# Patient Record
Sex: Female | Born: 1995 | Race: White | Hispanic: No | Marital: Single | Smoking: Never smoker
Health system: Southern US, Community
[De-identification: ages and names within clinical notes are randomized; demographics above are authoritative.]

## PROBLEM LIST (undated history)

## (undated) ENCOUNTER — Inpatient Hospital Stay (HOSPITAL_COMMUNITY): Payer: Self-pay

## (undated) DIAGNOSIS — Z789 Other specified health status: Secondary | ICD-10-CM

## (undated) DIAGNOSIS — G510 Bell's palsy: Secondary | ICD-10-CM

## (undated) DIAGNOSIS — R011 Cardiac murmur, unspecified: Secondary | ICD-10-CM

## (undated) HISTORY — DX: Bell's palsy: G51.0

## (undated) HISTORY — PX: NO PAST SURGERIES: SHX2092

---

## 2015-07-20 ENCOUNTER — Inpatient Hospital Stay (HOSPITAL_COMMUNITY)
Admission: AD | Admit: 2015-07-20 | Discharge: 2015-07-20 | Disposition: A | Discharge status: Home / Self Care | Payer: BLUE CROSS/BLUE SHIELD | Source: Ambulatory Visit | Attending: Obstetrics & Gynecology | Admitting: Obstetrics & Gynecology

## 2015-07-20 ENCOUNTER — Encounter (HOSPITAL_COMMUNITY): Payer: Self-pay | Admitting: *Deleted

## 2015-07-20 DIAGNOSIS — Z3A10 10 weeks gestation of pregnancy: Secondary | ICD-10-CM | POA: Insufficient documentation

## 2015-07-20 DIAGNOSIS — O26891 Other specified pregnancy related conditions, first trimester: Secondary | ICD-10-CM | POA: Insufficient documentation

## 2015-07-20 DIAGNOSIS — Z3200 Encounter for pregnancy test, result unknown: Secondary | ICD-10-CM | POA: Diagnosis present

## 2015-07-20 DIAGNOSIS — N911 Secondary amenorrhea: Secondary | ICD-10-CM | POA: Diagnosis not present

## 2015-07-20 HISTORY — DX: Other specified health status: Z78.9

## 2015-07-20 NOTE — Discharge Instructions (Signed)
First Trimester of Pregnancy The first trimester of pregnancy is from week 1 until the end of week 12 (months 1 through 3). During this time, your baby will begin to develop inside you. At 6-8 weeks, the eyes and face are formed, and the heartbeat can be seen on ultrasound. At the end of 12 weeks, all the baby's organs are formed. Prenatal care is all the medical care you receive before the birth of your baby. Make sure you get good prenatal care and follow all of your doctor's instructions. HOME CARE  Medicines  Take medicine only as told by your doctor. Some medicines are safe and some are not during pregnancy.  Take your prenatal vitamins as told by your doctor.  Take medicine that helps you poop (stool softener) as needed if your doctor says it is okay. Diet  Eat regular, healthy meals.  Your doctor will tell you the amount of weight gain that is right for you.  Avoid raw meat and uncooked cheese.  If you feel sick to your stomach (nauseous) or throw up (vomit):  Eat 4 or 5 small meals a day instead of 3 large meals.  Try eating a few soda crackers.  Drink liquids between meals instead of during meals.  If you have a hard time pooping (constipation):  Eat high-fiber foods like fresh vegetables, fruit, and whole grains.  Drink enough fluids to keep your pee (urine) clear or pale yellow. Activity and Exercise  Exercise only as told by your doctor. Stop exercising if you have cramps or pain in your lower belly (abdomen) or low back.  Try to avoid standing for long periods of time. Move your legs often if you must stand in one place for a long time.  Avoid heavy lifting.  Wear low-heeled shoes. Sit and stand up straight.  You can have sex unless your doctor tells you not to. Relief of Pain or Discomfort  Wear a good support bra if your breasts are sore.  Take warm water baths (sitz baths) to soothe pain or discomfort caused by hemorrhoids. Use hemorrhoid cream if your  doctor says it is okay.  Rest with your legs raised if you have leg cramps or low back pain.  Wear support hose if you have puffy, bulging veins (varicose veins) in your legs. Raise (elevate) your feet for 15 minutes, 3-4 times a day. Limit salt in your diet. Prenatal Care  Schedule your prenatal visits by the twelfth week of pregnancy.  Write down your questions. Take them to your prenatal visits.  Keep all your prenatal visits as told by your doctor. Safety  Wear your seat belt at all times when driving.  Make a list of emergency phone numbers. The list should include numbers for family, friends, the hospital, and police and fire departments. General Tips  Ask your doctor for a referral to a local prenatal class. Begin classes no later than at the start of month 6 of your pregnancy.  Ask for help if you need counseling or help with nutrition. Your doctor can give you advice or tell you where to go for help.  Do not use hot tubs, steam rooms, or saunas.  Do not douche or use tampons or scented sanitary pads.  Do not cross your legs for long periods of time.  Avoid litter boxes and soil used by cats.  Avoid all smoking, herbs, and alcohol. Avoid drugs not approved by your doctor.  Visit your dentist. At home, brush your teeth  with a soft toothbrush. Be gentle when you floss. GET HELP IF:  You are dizzy.  You have mild cramps or pressure in your lower belly.  You have a nagging pain in your belly area.  You continue to feel sick to your stomach, throw up, or have watery poop (diarrhea).  You have a bad smelling fluid coming from your vagina.  You have pain with peeing (urination).  You have increased puffiness (swelling) in your face, hands, legs, or ankles. GET HELP RIGHT AWAY IF:   You have a fever.  You are leaking fluid from your vagina.  You have spotting or bleeding from your vagina.  You have very bad belly cramping or pain.  You gain or lose weight  rapidly.  You throw up blood. It may look like coffee grounds.  You are around people who have [place] measles, fifth disease, or chickenpox.  You have a very bad headache.  You have shortness of breath.  You have any kind of trauma, such as from a fall or a car accident. Document Released: 04/03/2008 Document Revised: 03/02/2014 Document Reviewed: 08/26/2013 [facility] Patient Information 2015 [place], [place]. This information is not intended to replace advice given to you by your health care provider. Make sure you discuss any questions you have with your health care provider.   Safe Medications in Pregnancy   Acne: Benzoyl Peroxide Salicylic Acid  Backache/Headache: Tylenol: 2 regular strength every 4 hours OR              2 Extra strength every 6 hours  Colds/Coughs/Allergies: Benadryl (alcohol free) 25 mg every 6 hours as needed Breath right strips Claritin Cepacol throat lozenges Chloraseptic throat spray Cold-Eeze- up to three times per day Cough drops, alcohol free Flonase (by prescription only) Guaifenesin Mucinex Robitussin DM (plain only, alcohol free) Saline nasal spray/drops Sudafed (pseudoephedrine) & Actifed ** use only after [redacted] weeks gestation and if you do not have high blood pressure Tylenol Vicks Vaporub Zinc lozenges Zyrtec   Constipation: Colace Ducolax suppositories Fleet enema Glycerin suppositories Metamucil Milk of magnesia Miralax Senokot Smooth move tea  Diarrhea: Kaopectate Imodium A-D  *NO pepto Bismol  Hemorrhoids: Anusol Anusol HC Preparation H Tucks  Indigestion: Tums Maalox Mylanta Zantac  Pepcid  Insomnia: Benadryl (alcohol free)  every 6 hours as needed Tylenol PM Unisom, no Gelcaps  Leg Cramps: Tums MagGel  Nausea/Vomiting:  Bonine Dramamine Emetrol Ginger extract Sea bands Meclizine  Nausea medication to take during pregnancy:  Unisom (doxylamine succinate 25 mg tablets) Take one tablet  daily at bedtime. If symptoms are not adequately controlled, the dose can be increased to a maximum recommended dose of two tablets daily (1/2 tablet in the morning, 1/2 tablet mid-afternoon and one at bedtime). Vitamin B6  tablets. Take one tablet twice a day (up to 200 mg per day).  Skin Rashes: Aveeno products Benadryl cream or  every 6 hours as needed Calamine Lotion 1% cortisone cream  Yeast infection: Gyne-lotrimin 7 Monistat 7   **If taking multiple medications, please check labels to avoid duplicating the same active ingredients **take medication as directed on the label ** Do not exceed 4000 mg of tylenol in 24 hours **Do not take medications that contain aspirin or ibuprofen

## 2015-07-20 NOTE — MAU Note (Signed)
Pretty much positive she is pregnant.. 2 +HPTs, one in August, one was 2 wks ago.  Just needs confirmation.

## 2015-07-20 NOTE — MAU Provider Note (Signed)
S:  Jacqueline Perkins is a 19 y.o. G1P0 at [redacted]w[redacted]d wks here for confirmation of pregnancy. Patient's last menstrual period was 05/07/2015.Marland Kitchen  Denies any vaginal bleeding or abdominal pain.  Plans to get prenatal care at [facility] [place].  O:  History reviewed. No pertinent past medical history.  No family history on file.   Filed Vitals:   07/20/15 1643  BP: 124/70  Pulse: 81  Temp: 98.2 F (36.8 C)  Resp: 18   General:  A&OX3 with no signs of acute distress. She appears well-developed and well-nourished. No distress.  Neck: Normal range of motion.  Pulmonary/Chest: Effort normal. No respiratory distress.  Musculoskeletal: Normal range of motion.  Neurological: She is alert and oriented to person, place, and time.  Skin: Skin is warm and dry.   A: FHT present by doppler - 167 bpm   P: Explained benefits of taking prenatal vitamins w/folic acid early in pregnancy. Begin prenatal care. Reviewed warning signs of pregnancy. Pregnancy verification letter given  Judeth Horn, NP

## 2015-07-28 ENCOUNTER — Ambulatory Visit (INDEPENDENT_AMBULATORY_CARE_PROVIDER_SITE_OTHER): Payer: BLUE CROSS/BLUE SHIELD | Admitting: Certified Nurse Midwife

## 2015-07-28 ENCOUNTER — Encounter: Payer: Self-pay | Admitting: Certified Nurse Midwife

## 2015-07-28 ENCOUNTER — Encounter: Payer: Self-pay | Admitting: Obstetrics

## 2015-07-28 VITALS — BP 118/71 | HR 83 | Wt 157.0 lb

## 2015-07-28 DIAGNOSIS — Z3401 Encounter for supervision of normal first pregnancy, first trimester: Secondary | ICD-10-CM

## 2015-07-28 DIAGNOSIS — O219 Vomiting of pregnancy, unspecified: Secondary | ICD-10-CM

## 2015-07-28 DIAGNOSIS — Z3400 Encounter for supervision of normal first pregnancy, unspecified trimester: Secondary | ICD-10-CM | POA: Insufficient documentation

## 2015-07-28 LAB — POCT URINALYSIS DIPSTICK
BILIRUBIN UA: NEGATIVE
GLUCOSE UA: NEGATIVE
Ketones, UA: NEGATIVE
Leukocytes, UA: NEGATIVE
Nitrite, UA: NEGATIVE
PH UA: 6
Protein, UA: NEGATIVE
RBC UA: NEGATIVE
UROBILINOGEN UA: NEGATIVE

## 2015-07-28 MED ORDER — PROMETHAZINE HCL 25 MG PO TABS: 25.0000 mg | ORAL_TABLET | Freq: Four times a day (QID) | ORAL | Status: DC | PRN

## 2015-07-28 NOTE — Progress Notes (Signed)
Subjective:    Jacqueline Perkins is being seen today for her first obstetrical visit.  This is not a planned pregnancy. She is at [redacted]w[redacted]d gestation. Her obstetrical history is significant for none. Relationship with FOB: significant other, living together, Jannifer Franklin. Patient does intend to breast feed. Pregnancy history fully reviewed.  The information documented in the HPI was reviewed and verified.  Menstrual History: OB History    Gravida Para Term Preterm AB TAB SAB Ectopic Multiple Living   1               Menarche age: 75  Patient's last menstrual period was 05/07/2015.  Sure of LMP    Past Medical History  Diagnosis Date  . Medical history non-contributory   . Bell's palsy     Past Surgical History  Procedure Laterality Date  . No past surgeries       (Not in a hospital admission) Not on File  Social History  Substance Use Topics  . Smoking status: Never Smoker   . Smokeless tobacco: Not on file  . Alcohol Use: No    Family History  Problem Relation Age of Onset  . Hyperlipidemia Father   . Diabetes Paternal Grandfather   . Bipolar disorder Mother      Review of Systems Constitutional: negative for weight loss Gastrointestinal: + for  Nausea & vomiting Genitourinary:negative for genital lesions and vaginal discharge and dysuria Musculoskeletal:negative for back pain Behavioral/Psych: negative for abusive relationship, depression, illegal drug usage and tobacco use    Objective:    BP 118/71 mmHg  Pulse 83  Wt 157 lb (71.215 kg)  LMP 05/07/2015 General Appearance:    Alert, cooperative, no distress, appears stated age  Head:    Normocephalic, without obvious abnormality, atraumatic  Eyes:    PERRL, conjunctiva/corneas clear, EOM's intact, fundi    benign, both eyes  Ears:    Normal TM's and external ear canals, both ears  Nose:   Nares normal, septum midline, mucosa normal, no drainage    or sinus tenderness  Throat:   Lips, mucosa, and tongue  normal; teeth and gums normal  Neck:   Supple, symmetrical, trachea midline, no adenopathy;    thyroid:  no enlargement/tenderness/nodules; no carotid   bruit or JVD  Back:     Symmetric, no curvature, ROM normal, no CVA tenderness  Lungs:     Clear to auscultation bilaterally, respirations unlabored  Chest Wall:    No tenderness or deformity   Heart:    Regular rate and rhythm, S1 and S2 normal, no murmur, rub   or gallop  Breast Exam:    No tenderness, masses, or nipple abnormality  Abdomen:     Soft, non-tender, bowel sounds active all four quadrants,    no masses, no organomegaly  Genitalia:    Normal female without lesion, discharge or tenderness  Extremities:   Extremities normal, atraumatic, no cyanosis or edema  Pulses:   2+ and symmetric all extremities  Skin:   Skin color, texture, turgor normal, no rashes or lesions  Lymph nodes:   Cervical, supraclavicular, and axillary nodes normal  Neurologic:   CNII-XII intact, normal strength, sensation and reflexes    throughout        Cervix:       Lab Review Urine pregnancy test Labs reviewed yes Radiologic studies reviewed no Assessment:    Pregnancy at [redacted]w[redacted]d weeks   N&V in early pregnancy  Plan:     Prenatal vitamins.  Counseling provided regarding continued use of seat belts, cessation of alcohol consumption, smoking or use of illicit drugs; infection precautions i.e., influenza/TDAP immunizations, toxoplasmosis,CMV, parvovirus, listeria and varicella; workplace safety, exercise during pregnancy; routine dental care, safe medications, sexual activity, hot tubs, saunas, pools, travel, caffeine use, fish and methlymercury, potential toxins, hair treatments, varicose veins Weight gain recommendations per IOM guidelines reviewed: underweight/BMI< 18.5--> gain 28 - 40 lbs; normal weight/BMI 18.5 - 24.9--> gain 25 - 35 lbs; overweight/BMI 25 - 29.9--> gain 15 - 25 lbs; obese/BMI >30->gain  11 - 20 lbs Problem list reviewed and  updated. FIRST/CF mutation testing/NIPT/QUAD SCREEN/fragile X/Ashkenazi Jewish population testing/Spinal muscular atrophy discussed: requested. Role of ultrasound in pregnancy discussed; fetal survey: requested. Amniocentesis discussed: not indicated. VBAC calculator score: VBAC consent form provided Meds ordered this encounter  Medications  . prenatal vitamin w/FE, FA (PRENATAL 1 + 1) 27-1 MG TABS tablet    Sig: Take 1 tablet by mouth daily at 12 noon.   Orders Placed This Encounter  Procedures  . Culture, OB Urine  . SureSwab, Vaginosis/Vaginitis Plus  . Obstetric panel  . HIV antibody  . Hemoglobinopathy evaluation  . Varicella zoster antibody, IgG  . Vit D  25 hydroxy (rtn osteoporosis monitoring)  . TSH  . POCT urinalysis dipstick    Follow up in 4 weeks. 50% of 30 min visit spent on counseling and coordination of care.

## 2015-07-29 LAB — OBSTETRIC PANEL
ANTIBODY SCREEN: NEGATIVE
BASOS PCT: 0 % (ref 0–1)
Basophils Absolute: 0 10*3/uL (ref 0.0–0.1)
EOS PCT: 1 % (ref 0–5)
Eosinophils Absolute: 0.1 10*3/uL (ref 0.0–0.7)
HEMATOCRIT: 37.8 % (ref 36.0–46.0)
HEMOGLOBIN: 12.6 g/dL (ref 12.0–15.0)
Hepatitis B Surface Ag: NEGATIVE
Lymphocytes Relative: 21 % (ref 12–46)
Lymphs Abs: 1.9 10*3/uL (ref 0.7–4.0)
MCH: 26.5 pg (ref 26.0–34.0)
MCHC: 33.3 g/dL (ref 30.0–36.0)
MCV: 79.4 fL (ref 78.0–100.0)
MONO ABS: 0.7 10*3/uL (ref 0.1–1.0)
MPV: 9.1 fL (ref 8.6–12.4)
Monocytes Relative: 8 % (ref 3–12)
NEUTROS ABS: 6.3 10*3/uL (ref 1.7–7.7)
Neutrophils Relative %: 70 % (ref 43–77)
Platelets: 316 10*3/uL (ref 150–400)
RBC: 4.76 MIL/uL (ref 3.87–5.11)
RDW: 15.8 % — ABNORMAL HIGH (ref 11.5–15.5)
RH TYPE: NEGATIVE
Rubella: 1.25 Index — ABNORMAL HIGH (ref <0.90)
WBC: 9 10*3/uL (ref 4.0–10.5)

## 2015-07-29 LAB — HIV ANTIBODY (ROUTINE TESTING W REFLEX): HIV 1&2 Ab, 4th Generation: NONREACTIVE

## 2015-07-29 LAB — TSH: TSH: 2.812 u[IU]/mL (ref 0.350–4.500)

## 2015-07-29 LAB — VARICELLA ZOSTER ANTIBODY, IGG: VARICELLA IGG: 727.4 {index} — AB (ref <135.00)

## 2015-07-29 LAB — VITAMIN D 25 HYDROXY (VIT D DEFICIENCY, FRACTURES): Vit D, 25-Hydroxy: 32 ng/mL (ref 30–100)

## 2015-07-29 LAB — CULTURE, OB URINE
Colony Count: NO GROWTH
Organism ID, Bacteria: NO GROWTH

## 2015-07-30 LAB — HEMOGLOBINOPATHY EVALUATION
HEMOGLOBIN OTHER: 0 %
HGB A2 QUANT: 2.6 % (ref 2.2–3.2)
HGB F QUANT: 0.3 % (ref 0.0–2.0)
Hgb A: 97.1 % (ref 96.8–97.8)
Hgb S Quant: 0 %

## 2015-08-02 LAB — SURESWAB, VAGINOSIS/VAGINITIS PLUS
ATOPOBIUM VAGINAE: NOT DETECTED Log (cells/mL)
C. ALBICANS, DNA: NOT DETECTED
C. GLABRATA, DNA: NOT DETECTED
C. TRACHOMATIS RNA, TMA: NOT DETECTED
C. parapsilosis, DNA: DETECTED — AB
C. tropicalis, DNA: NOT DETECTED
GARDNERELLA VAGINALIS: NOT DETECTED Log (cells/mL)
LACTOBACILLUS SPECIES: 6.3 Log (cells/mL)
MEGASPHAERA SPECIES: NOT DETECTED Log (cells/mL)
N. gonorrhoeae RNA, TMA: NOT DETECTED
T. VAGINALIS RNA, QL TMA: NOT DETECTED

## 2015-08-03 ENCOUNTER — Other Ambulatory Visit: Payer: Self-pay | Admitting: Certified Nurse Midwife

## 2015-08-03 DIAGNOSIS — B373 Candidiasis of vulva and vagina: Secondary | ICD-10-CM

## 2015-08-03 DIAGNOSIS — B3731 Acute candidiasis of vulva and vagina: Secondary | ICD-10-CM

## 2015-08-03 MED ORDER — TERCONAZOLE 0.4 % VA CREA: 1.0000 | TOPICAL_CREAM | Freq: Every day | VAGINAL | Status: DC

## 2015-08-03 MED ORDER — FLUCONAZOLE 100 MG PO TABS: 100.0000 mg | ORAL_TABLET | Freq: Once | ORAL | Status: DC

## 2015-08-04 ENCOUNTER — Other Ambulatory Visit: Payer: Self-pay | Admitting: *Deleted

## 2015-08-04 DIAGNOSIS — B373 Candidiasis of vulva and vagina: Secondary | ICD-10-CM

## 2015-08-04 DIAGNOSIS — B3731 Acute candidiasis of vulva and vagina: Secondary | ICD-10-CM

## 2015-08-04 MED ORDER — FLUCONAZOLE 100 MG PO TABS
100.0000 mg | ORAL_TABLET | Freq: Once | ORAL | Status: DC
Start: 2015-08-04 — End: 2015-11-11

## 2015-08-04 MED ORDER — TERCONAZOLE 0.4 % VA CREA: 1.0000 | TOPICAL_CREAM | Freq: Every day | VAGINAL | Status: DC

## 2015-08-04 NOTE — Progress Notes (Signed)
See lab note for details.  

## 2015-08-19 ENCOUNTER — Ambulatory Visit (INDEPENDENT_AMBULATORY_CARE_PROVIDER_SITE_OTHER): Payer: BLUE CROSS/BLUE SHIELD | Admitting: Certified Nurse Midwife

## 2015-08-19 ENCOUNTER — Encounter: Payer: Self-pay | Admitting: Obstetrics

## 2015-08-19 VITALS — BP 130/80 | HR 94 | Temp 97.8°F | Wt 159.0 lb

## 2015-08-19 DIAGNOSIS — N390 Urinary tract infection, site not specified: Secondary | ICD-10-CM

## 2015-08-19 DIAGNOSIS — Z3481 Encounter for supervision of other normal pregnancy, first trimester: Secondary | ICD-10-CM

## 2015-08-19 DIAGNOSIS — G4700 Insomnia, unspecified: Secondary | ICD-10-CM

## 2015-08-19 DIAGNOSIS — G4452 New daily persistent headache (NDPH): Secondary | ICD-10-CM

## 2015-08-19 DIAGNOSIS — Z3402 Encounter for supervision of normal first pregnancy, second trimester: Secondary | ICD-10-CM

## 2015-08-19 DIAGNOSIS — O219 Vomiting of pregnancy, unspecified: Secondary | ICD-10-CM

## 2015-08-19 LAB — POCT URINALYSIS DIPSTICK
Bilirubin, UA: NEGATIVE
Blood, UA: NEGATIVE
GLUCOSE UA: NEGATIVE
Ketones, UA: NEGATIVE
Leukocytes, UA: NEGATIVE
Nitrite, UA: NEGATIVE
PROTEIN UA: NEGATIVE
Spec Grav, UA: <=1.005
Urobilinogen, UA: NEGATIVE
pH, UA: 8

## 2015-08-19 MED ORDER — BUTALBITAL-APAP-CAFFEINE 50-325-40 MG PO TABS: 1.0000 | ORAL_TABLET | Freq: Four times a day (QID) | ORAL | Status: DC | PRN

## 2015-08-19 MED ORDER — ONDANSETRON HCL 4 MG PO TABS: 4.0000 mg | ORAL_TABLET | Freq: Every day | ORAL | Status: DC | PRN

## 2015-08-19 MED ORDER — DIPHENHYDRAMINE HCL 25 MG PO TABS: 25.0000 mg | ORAL_TABLET | Freq: Four times a day (QID) | ORAL | Status: DC | PRN

## 2015-08-19 MED ORDER — ACETAMINOPHEN 325 MG PO TABS: 650.0000 mg | ORAL_TABLET | Freq: Four times a day (QID) | ORAL | Status: DC | PRN

## 2015-08-19 MED ORDER — DOXYLAMINE SUCCINATE (SLEEP) 25 MG PO TABS: 25.0000 mg | ORAL_TABLET | Freq: Every evening | ORAL | Status: DC | PRN

## 2015-08-19 MED ORDER — MELATONIN-PYRIDOXINE 3-1 MG PO TABS: 1.0000 | ORAL_TABLET | Freq: Every day | ORAL | Status: DC

## 2015-08-19 NOTE — Progress Notes (Signed)
Patient is have symptoms of UTI- cloudy urine, feverish, odor to urine, frequency and random abdominal pain. Patient is also concerned about her exposure to her sister with mono.

## 2015-08-19 NOTE — Progress Notes (Signed)
  Subjective:    Jacqueline PeersCassandra Perkins is a 19 y.o. female being seen today for her obstetrical visit. She is at 816w6d gestation. Patient reports: backache, headache, nausea, no bleeding, no contractions, no cramping, no leaking, vomiting and insomnia.  Reports daily vomiting.  Desires to have improvement with her nausea.  Reports that she used to take unisom for her insomnia.  Reports trying tylenol for her HA, without improvement.  She is back from drill.  Information about birthing classes and breast pump information given.   .  Problem List Items Addressed This Visit    None    Visit Diagnoses    UTI (lower urinary tract infection)    -  Primary    Relevant Orders    POCT urinalysis dipstick (Completed)    Nausea and vomiting in pregnancy prior to [redacted] weeks gestation        Relevant Medications    ondansetron (ZOFRAN) 4 MG tablet    Melatonin-Pyridoxine (MELATONIN/VITAMIN B-6 EX ST) 3-1 MG TABS    New daily persistent headache        Relevant Medications    butalbital-acetaminophen-caffeine (FIORICET) 50-325-40 MG tablet    acetaminophen (TYLENOL) 325 MG tablet    Insomnia        Relevant Medications    diphenhydrAMINE (BENADRYL) 25 MG tablet    doxylamine, Sleep, (UNISOM) 25 MG tablet    Melatonin-Pyridoxine (MELATONIN/VITAMIN B-6 EX ST) 3-1 MG TABS    Supervision of other normal pregnancy, antepartum, first trimester        Supervision of normal first pregnancy in second trimester        Relevant Orders    US OB Comp + 14 Wk      Patient Active Problem List   Diagnosis Date Noted  . Supervision of normal first pregnancy 07/28/2015    Objective:     BP 130/80 mmHg  Pulse 94  Temp(Src) 97.8 F (36.6 C)  Wt 159 lb (72.122 kg)  LMP 05/07/2015 Uterine Size: Below umbilicus     Assessment:    Pregnancy @ 686w6d  weeks N&V in early pregnancy    Insomnia  Daily HA  Plan:    Problem list reviewed and updated. Labs reviewed.  Follow up in 4 weeks. FIRST/CF mutation  testing/NIPT/QUAD SCREEN/fragile X/Ashkenazi Jewish population testing/Spinal muscular atrophy discussed: requested. Role of ultrasound in pregnancy discussed; fetal survey: ordered. Amniocentesis discussed: not indicated. 50% of 25 minute visit spent on counseling and coordination of care.

## 2015-08-20 ENCOUNTER — Encounter: Payer: BLUE CROSS/BLUE SHIELD | Admitting: Certified Nurse Midwife

## 2015-08-25 ENCOUNTER — Encounter: Payer: BLUE CROSS/BLUE SHIELD | Admitting: Certified Nurse Midwife

## 2015-09-16 ENCOUNTER — Ambulatory Visit (INDEPENDENT_AMBULATORY_CARE_PROVIDER_SITE_OTHER): Payer: BLUE CROSS/BLUE SHIELD

## 2015-09-16 ENCOUNTER — Ambulatory Visit (INDEPENDENT_AMBULATORY_CARE_PROVIDER_SITE_OTHER): Payer: Medicaid Other | Admitting: Certified Nurse Midwife

## 2015-09-16 VITALS — BP 130/68 | HR 84 | Temp 98.2°F | Wt 171.0 lb

## 2015-09-16 DIAGNOSIS — Z36 Encounter for antenatal screening of mother: Secondary | ICD-10-CM | POA: Diagnosis not present

## 2015-09-16 DIAGNOSIS — Z3403 Encounter for supervision of normal first pregnancy, third trimester: Secondary | ICD-10-CM | POA: Diagnosis not present

## 2015-09-16 DIAGNOSIS — Z3402 Encounter for supervision of normal first pregnancy, second trimester: Secondary | ICD-10-CM

## 2015-09-16 LAB — POCT URINALYSIS DIPSTICK
BILIRUBIN UA: NEGATIVE
Blood, UA: NEGATIVE
GLUCOSE UA: NEGATIVE
Ketones, UA: NEGATIVE
Leukocytes, UA: NEGATIVE
NITRITE UA: NEGATIVE
Protein, UA: NEGATIVE
Spec Grav, UA: 1.015
UROBILINOGEN UA: NEGATIVE
pH, UA: 8

## 2015-09-16 NOTE — Progress Notes (Signed)
Subjective:    Robyne PeersCassandra Kosman is a 19 y.o. female being seen today for her obstetrical visit. She is at 441w6d gestation. Patient reports: backache, no bleeding, no contractions, no cramping and no leaking . Fetal movement: normal.  Problem List Items Addressed This Visit    None    Visit Diagnoses    Encounter for supervision of normal first pregnancy in third trimester    -  Primary    Relevant Orders    POCT urinalysis dipstick (Completed)    AFP, Quad Screen      Patient Active Problem List   Diagnosis Date Noted  . Supervision of normal first pregnancy 07/28/2015   Objective:    BP 130/68 mmHg  Pulse 84  Temp(Src) 98.2 F (36.8 C)  Wt 171 lb (77.565 kg)  LMP 05/07/2015 FHT: 150 BPM  Uterine Size: size equals dates     Assessment:    Pregnancy @ 8941w6d    Lumbar back pain Plan:    OBGCT: discussed. Signs and symptoms of preterm labor: discussed.  Labs, problem list reviewed and updated 2 hr GTT planned Follow up in 4 weeks.

## 2015-09-21 LAB — AFP, QUAD SCREEN
AFP: 53.6 ng/mL
Age Alone: 1:1160 {titer}
CURR GEST AGE: 18.6 wks.days
Down Syndrome Scr Risk Est: <1:38500 {titer}
HCG TOTAL: 21.02 [IU]/mL
INH: 153.1 pg/mL
Interpretation-AFP: NEGATIVE
MOM FOR INH: 0.97
MoM for AFP: 1.17
MoM for hCG: 0.9
Open Spina bifida: NEGATIVE
Tri 18 Scr Risk Est: NEGATIVE
Trisomy 18 (Edward) Syndrome Interp.: 1:109000 {titer}
UE3 MOM: 1.33
uE3 Value: 1.9 ng/mL

## 2015-10-01 ENCOUNTER — Other Ambulatory Visit: Payer: Self-pay | Admitting: Certified Nurse Midwife

## 2015-10-14 ENCOUNTER — Ambulatory Visit (INDEPENDENT_AMBULATORY_CARE_PROVIDER_SITE_OTHER): Payer: BLUE CROSS/BLUE SHIELD | Admitting: Certified Nurse Midwife

## 2015-10-14 VITALS — BP 113/71 | HR 83 | Temp 97.6°F | Ht 65.0 in | Wt 184.0 lb

## 2015-10-14 DIAGNOSIS — Z3402 Encounter for supervision of normal first pregnancy, second trimester: Secondary | ICD-10-CM

## 2015-10-14 LAB — POCT URINALYSIS DIPSTICK
Bilirubin, UA: NEGATIVE
Blood, UA: NEGATIVE
Glucose, UA: NEGATIVE
KETONES UA: NEGATIVE
LEUKOCYTES UA: NEGATIVE
Nitrite, UA: NEGATIVE
PH UA: 7
PROTEIN UA: NEGATIVE
SPEC GRAV UA: 1.01
Urobilinogen, UA: NEGATIVE

## 2015-10-14 NOTE — Progress Notes (Signed)
Subjective:    Jacqueline PeersCassandra Perkins is a 19 y.o. female being seen today for her obstetrical visit. She is at 6735w6d gestation. Patient reports: no complaints . Fetal movement: normal.  Problem List Items Addressed This Visit    None    Visit Diagnoses    Encounter for supervision of normal first pregnancy in second trimester    -  Primary    Relevant Orders    POCT urinalysis dipstick (Completed)      Patient Active Problem List   Diagnosis Date Noted  . Supervision of normal first pregnancy 07/28/2015   Objective:    BP 113/71 mmHg  Pulse 83  Temp(Src) 97.6 F (36.4 C)  Ht 5\' 5"  (1.651 m)  Wt 184 lb (83.462 kg)  BMI 30.62 kg/m2  LMP 05/07/2015 FHT: 150 BPM  Uterine Size: size equals dates     Assessment:    Pregnancy @ 5935w6d    doing well Plan:    OBGCT: discussed. Signs and symptoms of preterm labor: discussed.  Labs, problem list reviewed and updated 2 hr GTT planned Follow up in 4 weeks.

## 2015-10-31 NOTE — L&D Delivery Note (Addendum)
Delivery Note  This is a 20 year old G 1 P0 who was admitted for Not in labor. and IOL for postdates with suspected LGA infant. She progressed with cytotec, pitocin and epidural to the second stage of labor.  She pushed for 30 min.  Dr. Clearance CootsHarper present for suspected LGA status along with NICU for thick meconium and suspected LGA status.  At 3:37 PM she delivered a viable infant female, cephalic, over an intact perineum.  A nuchal cord   was not identified. Infant placed on maternal abdomen.  Pitocin bolus started.  Delayed cord clamping was performed for 5-10 minutes. Brisk vaginal bleeding noted before cord clamping, Cytotec PR and Methergine IM given prophylactically for prolonged induction.   Cord double clamped and cut.  Placenta to cord blood donation. Apgar scores were 9 and 9. The placenta delivered spontaneously, shultz, with a 3 vessel cord.  Inspection revealed none. The uterus was firm bleeding stable.  EBL was 500.    Placenta and umbilical artery blood gas were not sent.  There were no complications during the procedure.  Mom and baby skin to skin following delivery. Left in stable condition.  Female was delivered via Vaginal, Spontaneous Delivery (Presentation: Right Occiput Anterior).  APGAR: 9, 9; weight  .   Placenta status: Intact, Spontaneous.  Cord: 3 vessels with the following complications: None.  Cord pH: N/A  Anesthesia: Epidural  Episiotomy: None Lacerations: None Suture Repair: none Est. Blood Loss (mL): 500  Mom to postpartum.  Baby to Couplet care / Skin to Skin.  Roe Coombsachelle A Denney, CNM 02/17/2016, 4:27 PM

## 2015-11-11 ENCOUNTER — Ambulatory Visit (INDEPENDENT_AMBULATORY_CARE_PROVIDER_SITE_OTHER): Payer: BLUE CROSS/BLUE SHIELD | Admitting: Certified Nurse Midwife

## 2015-11-11 VITALS — BP 124/71 | HR 97 | Temp 98.6°F | Wt 198.0 lb

## 2015-11-11 DIAGNOSIS — Z3402 Encounter for supervision of normal first pregnancy, second trimester: Secondary | ICD-10-CM

## 2015-11-11 LAB — POCT URINALYSIS DIPSTICK
Bilirubin, UA: NEGATIVE
Blood, UA: NEGATIVE
Glucose, UA: NEGATIVE
KETONES UA: NEGATIVE
NITRITE UA: NEGATIVE
PH UA: 8
PROTEIN UA: NEGATIVE
Spec Grav, UA: 1.01
UROBILINOGEN UA: NEGATIVE

## 2015-11-11 MED ORDER — PRENATE PIXIE 10-0.6-0.4-200 MG PO CAPS: 1.0000 | ORAL_CAPSULE | Freq: Every day | ORAL | Status: DC

## 2015-11-11 NOTE — Progress Notes (Signed)
Subjective:    Jacqueline PeersCassandra Perkins is a 20 y.o. female being seen today for her obstetrical visit. She is at 3072w6d gestation. Patient reports: no complaints . Fetal movement: normal.  Problem List Items Addressed This Visit    None    Visit Diagnoses    Encounter for supervision of normal first pregnancy in second trimester    -  Primary    Relevant Orders    POCT urinalysis dipstick      Patient Active Problem List   Diagnosis Date Noted  . Supervision of normal first pregnancy 07/28/2015   Objective:    BP 124/71 mmHg  Pulse 97  Temp(Src) 98.6 F (37 C)  Wt 198 lb (89.812 kg)  LMP 05/07/2015 FHT: 145 BPM  Uterine Size: size equals dates     Assessment:    Pregnancy @ 7072w6d    RH-  Doing well Plan:    OBGCT: discussed. Signs and symptoms of preterm labor: discussed.  Labs, problem list reviewed and updated 2 hr GTT planned Follow up in 1 week for OGTT & Rhogam.

## 2015-11-18 ENCOUNTER — Other Ambulatory Visit: Payer: BLUE CROSS/BLUE SHIELD

## 2015-11-18 ENCOUNTER — Ambulatory Visit (INDEPENDENT_AMBULATORY_CARE_PROVIDER_SITE_OTHER): Payer: BLUE CROSS/BLUE SHIELD | Admitting: Certified Nurse Midwife

## 2015-11-18 VITALS — BP 121/82 | HR 98 | Temp 98.0°F | Wt 200.0 lb

## 2015-11-18 DIAGNOSIS — Z3402 Encounter for supervision of normal first pregnancy, second trimester: Secondary | ICD-10-CM

## 2015-11-18 LAB — POCT URINALYSIS DIPSTICK
BILIRUBIN UA: NEGATIVE
Blood, UA: NEGATIVE
GLUCOSE UA: NEGATIVE
KETONES UA: NEGATIVE
Leukocytes, UA: NEGATIVE
Nitrite, UA: NEGATIVE
PH UA: 7
Protein, UA: NEGATIVE
Urobilinogen, UA: NEGATIVE

## 2015-11-18 LAB — CBC
HCT: 34.5 % — ABNORMAL LOW (ref 36.0–46.0)
HEMOGLOBIN: 11.4 g/dL — AB (ref 12.0–15.0)
MCH: 28.6 pg (ref 26.0–34.0)
MCHC: 33 g/dL (ref 30.0–36.0)
MCV: 86.7 fL (ref 78.0–100.0)
MPV: 8.2 fL — AB (ref 8.6–12.4)
Platelets: 363 10*3/uL (ref 150–400)
RBC: 3.98 MIL/uL (ref 3.87–5.11)
RDW: 13.8 % (ref 11.5–15.5)
WBC: 12.1 10*3/uL — AB (ref 4.0–10.5)

## 2015-11-18 LAB — GLUCOSE TOLERANCE, 2 HOURS W/ 1HR
GLUCOSE, 2 HOUR: 96 mg/dL (ref 70–139)
GLUCOSE, FASTING: 74 mg/dL (ref 65–99)
GLUCOSE: 110 mg/dL (ref 70–170)

## 2015-11-18 NOTE — Progress Notes (Signed)
Rhogam injection given at today's visit, pt tolerated well. Lot [id number] Exp 08-13-2016 NDC 5409-8119-14

## 2015-11-19 LAB — HIV ANTIBODY (ROUTINE TESTING W REFLEX): HIV: NONREACTIVE

## 2015-11-19 LAB — RPR

## 2015-11-19 NOTE — Progress Notes (Signed)
Subjective:    Jacqueline Perkins is a 20 y.o. female being seen today for her obstetrical visit. She is at [redacted]w[redacted]d gestation. Patient reports backache, no bleeding, no contractions, no cramping and no leaking. Fetal movement: normal.  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  Encouraged patient to follow chain of command, if needed we would be happy to give her a note regarding her pregnancy and physical requirements.    Problem List Items Addressed This Visit    None    Visit Diagnoses    Encounter for supervision of normal first pregnancy in second trimester    -  Primary    Relevant Orders    Glucose Tolerance, 2 Hours w/1 Hour (Completed)    CBC    HIV antibody    RPR    POCT urinalysis dipstick (Completed)      Patient Active Problem List   Diagnosis Date Noted  . Supervision of normal first pregnancy 07/28/2015   Objective:    BP 121/82 mmHg  Pulse 98  Temp(Src) 98 F (36.7 C)  Wt 200 lb (90.719 kg)  LMP 05/07/2015 FHT:  145 BPM  Uterine Size: size equals dates  Presentation: cephalic     Assessment:    Pregnancy @ [redacted]w[redacted]d weeks   Doing well  RH-: Rhogam given  Plan:   Rx: abdominal maternity support belt   labs reviewed, problem list updated Consent signed. GBS planning TDAP offered  Rhogam given for RH negative Pediatrician: discussed. Infant feeding: plans to breastfeed. Maternity leave: discussed. Cigarette smoking: never smoked. Orders Placed This Encounter  Procedures  . Glucose Tolerance, 2 Hours w/1 Hour  . CBC  . HIV antibody  . RPR  . POCT urinalysis dipstick   No orders of the defined types were placed in this encounter.   Follow up in 2 Weeks.

## 2015-12-01 ENCOUNTER — Inpatient Hospital Stay (HOSPITAL_COMMUNITY): Payer: BLUE CROSS/BLUE SHIELD

## 2015-12-01 ENCOUNTER — Inpatient Hospital Stay (HOSPITAL_COMMUNITY)
Admission: AD | Admit: 2015-12-01 | Discharge: 2015-12-02 | Disposition: A | Discharge status: Home / Self Care | Payer: BLUE CROSS/BLUE SHIELD | Source: Ambulatory Visit | Attending: Obstetrics | Admitting: Obstetrics

## 2015-12-01 DIAGNOSIS — T1490 Injury, unspecified: Secondary | ICD-10-CM

## 2015-12-01 DIAGNOSIS — Z3A29 29 weeks gestation of pregnancy: Secondary | ICD-10-CM | POA: Insufficient documentation

## 2015-12-01 DIAGNOSIS — O9A212 Injury, poisoning and certain other consequences of external causes complicating pregnancy, second trimester: Secondary | ICD-10-CM

## 2015-12-01 DIAGNOSIS — W109XXA Fall (on) (from) unspecified stairs and steps, initial encounter: Secondary | ICD-10-CM | POA: Insufficient documentation

## 2015-12-01 DIAGNOSIS — O26893 Other specified pregnancy related conditions, third trimester: Secondary | ICD-10-CM | POA: Insufficient documentation

## 2015-12-01 DIAGNOSIS — O9A213 Injury, poisoning and certain other consequences of external causes complicating pregnancy, third trimester: Secondary | ICD-10-CM

## 2015-12-01 LAB — URINALYSIS, ROUTINE W REFLEX MICROSCOPIC
BILIRUBIN URINE: NEGATIVE
GLUCOSE, UA: NEGATIVE mg/dL
Hgb urine dipstick: NEGATIVE
KETONES UR: NEGATIVE mg/dL
Leukocytes, UA: NEGATIVE
Nitrite: NEGATIVE
PH: 6.5 (ref 5.0–8.0)
Protein, ur: NEGATIVE mg/dL
SPECIFIC GRAVITY, URINE: 1.01 (ref 1.005–1.030)

## 2015-12-01 NOTE — MAU Provider Note (Signed)
History     CSN: [id number]  Arrival date and time: 12/01/15 2242   First Provider Initiated Contact with Patient 12/01/15 2334      Chief Complaint  Patient presents with  . Fall   HPI Comments: Jacqueline Perkins is a 20 y.o. G1P0 at [redacted]w[redacted]d who presents today after a fall. She states that she fell down 2 steps around 2100. She denies any VB or LOF. She states that the fetus has been active. She states that her next appointment with Dr. Clearance Coots is in the morning.   Fall The accident occurred 1 to 3 hours ago (around 2100 ). The fall occurred while walking (missed last 2 steps when going down steps. ). She fell from a height of 1 to 2 ft. She landed on hard floor. There was no blood loss. The point of impact was the buttocks. The pain is present in the back. The pain is at a severity of 2/10. Pertinent negatives include no abdominal pain, fever, headaches, nausea or vomiting. She has tried nothing for the symptoms.    Past Medical History  Diagnosis Date  . Medical history non-contributory   . Bell's palsy     Past Surgical History  Procedure Laterality Date  . No past surgeries      Family History  Problem Relation Age of Onset  . Hyperlipidemia Father   . Diabetes Paternal Grandfather   . Bipolar disorder Mother     Social History  Substance Use Topics  . Smoking status: Never Smoker   . Smokeless tobacco: Not on file  . Alcohol Use: No    Allergies: No Known Allergies  Prescriptions prior to admission  Medication Sig Dispense Refill Last Dose  . acetaminophen (TYLENOL) 325 MG tablet Take 2 tablets (650 mg total) by mouth every 6 (six) hours as needed. (Patient not taking: Reported on 11/18/2015) 120 tablet 12 Not Taking  . butalbital-acetaminophen-caffeine (FIORICET) 50-325-40 MG tablet Take 1-2 tablets by mouth every 6 (six) hours as needed for headache. (Patient not taking: Reported on 09/16/2015) 45 tablet 5 Not Taking  . Prenat-FeAsp-Meth-FA-DHA w/o A (PRENATE  PIXIE) 10-0.6-0.4-200 MG CAPS Take 1 tablet by mouth daily. 30 capsule 12 Taking    Review of Systems  Constitutional: Negative for fever and chills.  Gastrointestinal: Negative for nausea, vomiting, abdominal pain, diarrhea and constipation.  Genitourinary: Negative for dysuria, urgency and frequency.  Neurological: Negative for headaches.   Physical Exam   Blood pressure 150/76, pulse 89, temperature 98.8 F (37.1 C), temperature source Oral, resp. rate 16, last menstrual period 05/07/2015, SpO2 100 %.  Physical Exam  Nursing note and vitals reviewed. Constitutional: She is oriented to person, place, and time. She appears well-developed and well-nourished. No distress.  HENT:  Head: Normocephalic.  Cardiovascular: Normal rate.   Respiratory: Effort normal.  GI: Soft. There is no tenderness. There is no rebound.  Musculoskeletal: Normal range of motion. She exhibits no tenderness.  Full ROM of both ankles No point tenderness along spine.   Neurological: She is alert and oriented to person, place, and time.  Skin: Skin is warm and dry.  Psychiatric: She has a normal mood and affect.   FHT: 140, moderate with 15x15 accels, no decels Toco: no UCs   [place]: AFI 14.31, 48%tile VTX No previa No abruption seen  MAU Course  Procedures  MDM 0100: FHT remain reactive. 4 hour since fall. Will DC home. Patient has appointment in the morning.   Assessment and Plan  1. Traumatic injury during pregnancy in second trimester    DC home Comfort measures reviewed  3rd Trimester precautions  Bleeding precautions PTL precautions  Fetal kick counts RX: none  Return to MAU as needed FU with OB as planned  Follow-up Information    Follow up with Brock Bad, MD.   Specialty:  Obstetrics and Gynecology   Why:  As scheduled   Contact information:   [address] [place] [address] [phone]         Tawnya Crook 12/01/2015, 11:37 PM

## 2015-12-01 NOTE — MAU Note (Signed)
Pt states she had a fall at around 2100, states she missed the last 2 steps and fell on her ankle and bottom. 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 Denies bleeding. Reports positive fetal movement since the fall.

## 2015-12-02 ENCOUNTER — Encounter: Payer: Self-pay | Admitting: *Deleted

## 2015-12-02 ENCOUNTER — Ambulatory Visit (INDEPENDENT_AMBULATORY_CARE_PROVIDER_SITE_OTHER): Payer: BLUE CROSS/BLUE SHIELD | Admitting: Certified Nurse Midwife

## 2015-12-02 VITALS — BP 112/77 | HR 105 | Wt 204.0 lb

## 2015-12-02 DIAGNOSIS — Z3403 Encounter for supervision of normal first pregnancy, third trimester: Secondary | ICD-10-CM

## 2015-12-02 DIAGNOSIS — O26893 Other specified pregnancy related conditions, third trimester: Secondary | ICD-10-CM | POA: Diagnosis not present

## 2015-12-02 LAB — POCT URINALYSIS DIPSTICK
Bilirubin, UA: NEGATIVE
GLUCOSE UA: NEGATIVE
Ketones, UA: NEGATIVE
Leukocytes, UA: NEGATIVE
Nitrite, UA: NEGATIVE
PROTEIN UA: NEGATIVE
RBC UA: NEGATIVE
SPEC GRAV UA: 1.01
UROBILINOGEN UA: NEGATIVE
pH, UA: 7

## 2015-12-02 NOTE — Discharge Instructions (Signed)

## 2015-12-02 NOTE — Progress Notes (Signed)
Subjective:    Jacqueline Perkins is a 20 y.o. female being seen today for her obstetrical visit. She is at [redacted]w[redacted]d gestation. Patient reports backache, no bleeding, no contractions, no cramping and no leaking. Fetal movement: normal.  Recent fall down stairs at house yesterday, went to MAU, was cleared.  Normal [place] results reviewed.  FOB is out of town.  Has abdominal maternity support belt that she wears.  Is supposed to have drill this weekend, letter written for no drill until after postpartum due to recent fall and lower abdominal pain/lumbar back pain.    Problem List Items Addressed This Visit    None    Visit Diagnoses    Encounter for supervision of normal first pregnancy in third trimester    -  Primary    Relevant Orders    POCT urinalysis dipstick (Completed)      Patient Active Problem List   Diagnosis Date Noted  . 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     labs reviewed, problem list updated Consent signed. GBS planning TDAP offered  Rhogam given for RH negative Pediatrician: discussed. Infant feeding: plans to breastfeed, has breast pump. Maternity leave: discussed, forms done. Cigarette smoking: never smoked. Orders Placed This Encounter  Procedures  . POCT urinalysis dipstick   No orders of the defined types were placed in this encounter.   Follow up in 2 Weeks.

## 2015-12-16 ENCOUNTER — Ambulatory Visit (INDEPENDENT_AMBULATORY_CARE_PROVIDER_SITE_OTHER): Payer: BLUE CROSS/BLUE SHIELD | Admitting: Certified Nurse Midwife

## 2015-12-16 VITALS — BP 128/82 | HR 104 | Temp 98.3°F | Wt 210.0 lb

## 2015-12-16 DIAGNOSIS — Z3403 Encounter for supervision of normal first pregnancy, third trimester: Secondary | ICD-10-CM

## 2015-12-16 DIAGNOSIS — O219 Vomiting of pregnancy, unspecified: Secondary | ICD-10-CM

## 2015-12-16 DIAGNOSIS — G4700 Insomnia, unspecified: Secondary | ICD-10-CM

## 2015-12-16 LAB — POCT URINALYSIS DIPSTICK
BILIRUBIN UA: NEGATIVE
Blood, UA: NEGATIVE
GLUCOSE UA: NEGATIVE
KETONES UA: NEGATIVE
Nitrite, UA: NEGATIVE
PH UA: 7
Protein, UA: NEGATIVE
Spec Grav, UA: 1.015
Urobilinogen, UA: NEGATIVE

## 2015-12-16 MED ORDER — DIPHENHYDRAMINE HCL (SLEEP) 50 MG PO CAPS: 1.0000 | ORAL_CAPSULE | Freq: Every day | ORAL | Status: DC

## 2015-12-16 MED ORDER — ONDANSETRON HCL 4 MG PO TABS: 4.0000 mg | ORAL_TABLET | Freq: Every day | ORAL | Status: DC | PRN

## 2015-12-16 MED ORDER — DIPHENHYDRAMINE HCL 25 MG PO TABS: 50.0000 mg | ORAL_TABLET | Freq: Every day | ORAL | Status: DC

## 2015-12-17 ENCOUNTER — Encounter: Payer: Self-pay | Admitting: *Deleted

## 2015-12-17 NOTE — Progress Notes (Addendum)
Subjective:    Jacqueline Perkins is a 20 y.o. female being seen today for her obstetrical visit. She is at [redacted]w[redacted]d gestation. Patient reports backache, fatigue, no bleeding, no leaking and occasional contractions. Reports insomnia, has not tried anything for it, states she takes some naps.  Reports N&V here recently.  Also reports some vaginal discharge with itching, that started about a week ago.  Denies any burning with urination.  Fetal movement: normal.  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  Spouse is due back in town in one week.  She is supposed to have drill in a few weeks.  Fax number received to fax the letter to.  Reports wearing abdominal maternity support belt during the day.    Problem List Items Addressed This Visit    None    Visit Diagnoses    Insomnia    -  Primary    Relevant Medications    DiphenhydrAMINE HCl, Sleep, 50 MG CAPS    diphenhydrAMINE (BENADRYL) 25 MG tablet    Nausea and vomiting during pregnancy        Relevant Medications    ondansetron (ZOFRAN) 4 MG tablet    Encounter for supervision of normal first pregnancy in third trimester        Relevant Orders    SureSwab, Vaginosis/Vaginitis Plus    POCT urinalysis dipstick (Completed)      Patient Active Problem List   Diagnosis Date Noted  . 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 GBS planning TDAP offered  Rhogam given for RH negative Pediatrician: discussed. Infant feeding: plans to  breastfeed. Maternity leave: discussed. Cigarette smoking: never smoked. Orders Placed This Encounter  Procedures  . SureSwab, Vaginosis/Vaginitis Plus  . POCT urinalysis dipstick   Meds ordered this encounter  Medications  . ondansetron (ZOFRAN) 4 MG tablet    Sig: Take 1 tablet (4 mg total) by mouth daily as needed.    Dispense:  30 tablet    Refill:  1  . DiphenhydrAMINE HCl, Sleep, 50 MG CAPS    Sig: Take 1 capsule (50 mg total) by mouth at bedtime.    Dispense:  30 each    Refill:  4  . diphenhydrAMINE (BENADRYL) 25 MG tablet    Sig: Take 2 tablets (50 mg total) by mouth at bedtime.    Dispense:  90 tablet    Refill:  4   Follow up in 2 Weeks.

## 2015-12-23 LAB — SURESWAB, VAGINOSIS/VAGINITIS PLUS
Atopobium vaginae: NOT DETECTED Log (cells/mL)
C. ALBICANS, DNA: NOT DETECTED
C. GLABRATA, DNA: NOT DETECTED
C. PARAPSILOSIS, DNA: NOT DETECTED
C. TRACHOMATIS RNA, TMA: NOT DETECTED
C. TROPICALIS, DNA: NOT DETECTED
Gardnerella vaginalis: NOT DETECTED Log (cells/mL)
LACTOBACILLUS SPECIES: 6.9 Log (cells/mL)
MEGASPHAERA SPECIES: NOT DETECTED Log (cells/mL)
N. gonorrhoeae RNA, TMA: NOT DETECTED
T. VAGINALIS RNA, QL TMA: NOT DETECTED

## 2015-12-28 ENCOUNTER — Other Ambulatory Visit: Payer: Self-pay | Admitting: Certified Nurse Midwife

## 2015-12-29 ENCOUNTER — Other Ambulatory Visit: Payer: Self-pay | Admitting: Certified Nurse Midwife

## 2015-12-30 ENCOUNTER — Ambulatory Visit (INDEPENDENT_AMBULATORY_CARE_PROVIDER_SITE_OTHER): Payer: BLUE CROSS/BLUE SHIELD | Admitting: Certified Nurse Midwife

## 2015-12-30 VITALS — BP 132/79 | HR 91 | Temp 97.8°F | Wt 214.0 lb

## 2015-12-30 DIAGNOSIS — Z3403 Encounter for supervision of normal first pregnancy, third trimester: Secondary | ICD-10-CM

## 2015-12-30 LAB — POCT URINALYSIS DIPSTICK
BILIRUBIN UA: NEGATIVE
GLUCOSE UA: NEGATIVE
KETONES UA: NEGATIVE
Leukocytes, UA: NEGATIVE
Nitrite, UA: NEGATIVE
Protein, UA: NEGATIVE
RBC UA: NEGATIVE
SPEC GRAV UA: 1.02
UROBILINOGEN UA: NEGATIVE
pH, UA: 6

## 2015-12-30 NOTE — Progress Notes (Signed)
Subjective:    Jacqueline Perkins is a 20 y.o. female being seen today for her obstetrical visit. She is at [redacted]w[redacted]d gestation. Patient reports backache, fatigue, heartburn, no bleeding, no contractions, no cramping and no leaking. Fetal movement: normal.  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  Spoke with her commander with her permission, no vital medical information was shared.  Commander stated that she would have desk duty, no heavy lifting, pushing, pulling, standing.  She will be able to have frequent breaks, water, bathroom privileges.  Patient verbalized understanding.   States she was going to take birth classes this weekend, educated to reschedule.    Problem List Items Addressed This Visit    None    Visit Diagnoses    Encounter for supervision of normal first pregnancy in third trimester    -  Primary    Relevant Orders    POCT urinalysis dipstick (Completed)      Patient Active Problem List   Diagnosis Date Noted  . Supervision of normal first pregnancy 07/28/2015   Objective:    BP 132/79 mmHg  Pulse 91  Temp(Src) 97.8 F (36.6 C)  Wt 214 lb (97.07 kg)  LMP 05/07/2015 FHT:  145 BPM  Uterine Size: size equals dates  Presentation: cephalic     Assessment:    Pregnancy @ [redacted]w[redacted]d weeks   Normal pregnancy discomforts.    Plan:     labs reviewed, problem list updated Consent signed. GBS planning TDAP offered  Rhogam given for RH negative Pediatrician: discussed. Infant feeding: plans to breastfeed. Maternity leave: discussed. Cigarette smoking: never smoked. Orders Placed This Encounter  Procedures  . POCT urinalysis dipstick   No orders of the defined types were placed in this encounter.   Follow up in 2 Weeks with GBS.

## 2016-01-06 ENCOUNTER — Encounter (HOSPITAL_COMMUNITY): Payer: Self-pay

## 2016-01-06 ENCOUNTER — Inpatient Hospital Stay (HOSPITAL_COMMUNITY)
Admission: AD | Admit: 2016-01-06 | Discharge: 2016-01-07 | Disposition: A | Discharge status: Home / Self Care | Payer: BLUE CROSS/BLUE SHIELD | Source: Ambulatory Visit | Attending: Obstetrics | Admitting: Obstetrics

## 2016-01-06 DIAGNOSIS — Z3A35 35 weeks gestation of pregnancy: Secondary | ICD-10-CM | POA: Diagnosis not present

## 2016-01-06 DIAGNOSIS — O4703 False labor before 37 completed weeks of gestation, third trimester: Secondary | ICD-10-CM | POA: Insufficient documentation

## 2016-01-06 DIAGNOSIS — N898 Other specified noninflammatory disorders of vagina: Secondary | ICD-10-CM | POA: Diagnosis present

## 2016-01-06 DIAGNOSIS — O479 False labor, unspecified: Secondary | ICD-10-CM

## 2016-01-06 LAB — URINALYSIS, ROUTINE W REFLEX MICROSCOPIC
BILIRUBIN URINE: NEGATIVE
GLUCOSE, UA: NEGATIVE mg/dL
HGB URINE DIPSTICK: NEGATIVE
KETONES UR: NEGATIVE mg/dL
Leukocytes, UA: NEGATIVE
Nitrite: NEGATIVE
PROTEIN: NEGATIVE mg/dL
Specific Gravity, Urine: <1.005 — ABNORMAL LOW (ref 1.005–1.030)
pH: 5.5 (ref 5.0–8.0)

## 2016-01-06 LAB — OB RESULTS CONSOLE GC/CHLAMYDIA: GC PROBE AMP, GENITAL: NEGATIVE

## 2016-01-06 NOTE — MAU Provider Note (Signed)
History     CSN: [id number]  Arrival date and time: 01/06/16 2215   First Provider Initiated Contact with Patient 01/06/16 2342      Chief Complaint  Patient presents with  . Contractions  . Vaginal Discharge   Vaginal Discharge The patient's primary symptoms include pelvic pain and vaginal discharge. This is a new problem. The current episode started today. The problem occurs intermittently (about every 15 mins for about 4 hours. Seems to have stopped now. ). The problem has been resolved. The patient is experiencing no pain. The problem affects both sides. She is pregnant. Associated symptoms include abdominal pain. Pertinent negatives include no chills, constipation, diarrhea, dysuria, fever, frequency, nausea, urgency or vomiting. The vaginal discharge was copious and clear. There has been no bleeding. Nothing aggravates the symptoms. She has tried nothing for the symptoms. The treatment provided no relief.    Past Medical History  Diagnosis Date  . Medical history non-contributory   . Bell's palsy     Past Surgical History  Procedure Laterality Date  . No past surgeries      Family History  Problem Relation Age of Onset  . Hyperlipidemia Father   . Diabetes Paternal Grandfather   . Bipolar disorder Mother     Social History  Substance Use Topics  . Smoking status: Never Smoker   . Smokeless tobacco: None  . Alcohol Use: No    Allergies: No Known Allergies  Prescriptions prior to admission  Medication Sig Dispense Refill Last Dose  . DiphenhydrAMINE HCl, Sleep, 50 MG CAPS Take 1 capsule (50 mg total) by mouth at bedtime. 30 each 4 01/05/2016 at Unknown time  . Prenat-FeAsp-Meth-FA-DHA w/o A (PRENATE PIXIE) 10-0.6-0.4-200 MG CAPS Take 1 tablet by mouth daily. 30 capsule 12 01/05/2016 at Unknown time  . acetaminophen (TYLENOL) 325 MG tablet Take 2 tablets (650 mg total) by mouth every 6 (six) hours as needed. (Patient not taking: Reported on 11/18/2015) 120 tablet 12 Not  Taking  . butalbital-acetaminophen-caffeine (FIORICET) 50-325-40 MG tablet Take 1-2 tablets by mouth every 6 (six) hours as needed for headache. (Patient not taking: Reported on 09/16/2015) 45 tablet 5 Not Taking    Review of Systems  Constitutional: Negative for fever and chills.  Gastrointestinal: Positive for abdominal pain. Negative for nausea, vomiting, diarrhea and constipation.  Genitourinary: Positive for vaginal discharge and pelvic pain. Negative for dysuria, urgency and frequency.   Physical Exam   Blood pressure 149/85, pulse 112, temperature 98.8 F (37.1 C), temperature source Oral, resp. rate 16, height 5\' 5"  (1.651 m), weight 97.523 kg (215 lb), last menstrual period 05/07/2015, SpO2 100 %.  Physical Exam  Nursing note and vitals reviewed. Constitutional: She appears well-developed and well-nourished. No distress.  HENT:  Head: Normocephalic.  Cardiovascular: Normal rate.   Respiratory: Effort normal.  GI: Soft.  Genitourinary:   External: no lesion Vagina: small amount of white discharge Cervix: pink, smooth, FT/THICK/-2  Uterus: AGA    Musculoskeletal: Normal range of motion.  Neurological: She is alert.  Skin: Skin is warm and dry.  Psychiatric: She has a normal mood and affect.   FHT: 135, moderate with 15x15 accels, no decels Toco: no UCs   Results for orders placed or performed during the hospital encounter of 01/06/16 (from the past 24 hour(s))  Urinalysis, Routine w reflex microscopic (not at [facility])     Status: Abnormal   Collection Time: 01/06/16 10:39 PM  Result Value Ref Range   Color, Urine YELLOW YELLOW  APPearance CLEAR CLEAR   Specific Gravity, Urine <1.005 (L) 1.005 - 1.030   pH 5.5 5.0 - 8.0   Glucose, UA NEGATIVE NEGATIVE mg/dL   Hgb urine dipstick NEGATIVE NEGATIVE   Bilirubin Urine NEGATIVE NEGATIVE   Ketones, ur NEGATIVE NEGATIVE mg/dL   Protein, ur NEGATIVE NEGATIVE mg/dL   Nitrite NEGATIVE NEGATIVE   Leukocytes, UA NEGATIVE  NEGATIVE  Wet prep, genital     Status: Abnormal   Collection Time: 01/06/16 11:50 PM  Result Value Ref Range   Yeast Wet Prep HPF POC NONE SEEN NONE SEEN   Trich, Wet Prep NONE SEEN NONE SEEN   Clue Cells Wet Prep HPF POC NONE SEEN NONE SEEN   WBC, Wet Prep HPF POC MANY (A) NONE SEEN   Sperm NONE SEEN     MAU Course  Procedures  MDM   Assessment and Plan   1. Braxton Hicks contractions    DC home Comfort measures reviewed  3rd Trimester precautions  PTL precautions  Fetal kick counts RX: none  Return to MAU as needed FU with OB as planned  Follow-up Information    Follow up with Roe Coombs, CNM.   Specialty:  Certified Nurse Midwife   Why:  As scheduled   Contact information:   [address] [place] [address] [phone]         Tawnya Crook 01/06/2016, 11:43 PM

## 2016-01-06 NOTE — MAU Note (Signed)
Pt reports increased pressure and pain in her pelvis since the weekend. Also reports increased discharge and contractions q 15 minutes today.

## 2016-01-07 DIAGNOSIS — O4703 False labor before 37 completed weeks of gestation, third trimester: Secondary | ICD-10-CM

## 2016-01-07 DIAGNOSIS — Z3A35 35 weeks gestation of pregnancy: Secondary | ICD-10-CM | POA: Diagnosis not present

## 2016-01-07 LAB — WET PREP, GENITAL
CLUE CELLS WET PREP: NONE SEEN
SPERM: NONE SEEN
TRICH WET PREP: NONE SEEN
YEAST WET PREP: NONE SEEN

## 2016-01-07 LAB — GC/CHLAMYDIA PROBE AMP (~~LOC~~) NOT AT ARMC
CHLAMYDIA, DNA PROBE: NEGATIVE
Neisseria Gonorrhea: NEGATIVE

## 2016-01-07 NOTE — Discharge Instructions (Signed)

## 2016-01-13 ENCOUNTER — Ambulatory Visit (INDEPENDENT_AMBULATORY_CARE_PROVIDER_SITE_OTHER): Payer: BLUE CROSS/BLUE SHIELD | Admitting: Certified Nurse Midwife

## 2016-01-13 VITALS — BP 127/88 | HR 123 | Temp 98.1°F | Wt 217.0 lb

## 2016-01-13 DIAGNOSIS — Z3403 Encounter for supervision of normal first pregnancy, third trimester: Secondary | ICD-10-CM

## 2016-01-13 LAB — POCT URINALYSIS DIPSTICK
BILIRUBIN UA: NEGATIVE
Blood, UA: NEGATIVE
GLUCOSE UA: NEGATIVE
KETONES UA: NEGATIVE
LEUKOCYTES UA: NEGATIVE
NITRITE UA: NEGATIVE
PH UA: 7
Protein, UA: NEGATIVE
Spec Grav, UA: 1.02
Urobilinogen, UA: NEGATIVE

## 2016-01-13 NOTE — Progress Notes (Signed)
Subjective:    Jacqueline Perkins is a 20 y.o. female being seen today for her obstetrical visit. She is at 3837w6d gestation. Patient reports backache, no bleeding, no cramping, no leaking and occasional contractions. Fetal movement: normal.  Problem List Items Addressed This Visit    None     Patient Active Problem List   Diagnosis Date Noted  . Supervision of normal first pregnancy 07/28/2015   Objective:    BP 127/88 mmHg  Pulse 123  Temp(Src) 98.1 F (36.7 C)  Wt 217 lb (98.431 kg)  LMP 05/07/2015 FHT:  145 BPM  Uterine Size: size equals dates  Presentation: cephalic   Cervix: 1cm, long, thick, posterior.    Assessment:    Pregnancy @ 2137w6d weeks   Doing well  Plan:     labs reviewed, problem list updated Consent signed. GBS sent TDAP offered  Rhogam given for RH negative Pediatrician: discussed. Infant feeding: plans to breastfeed. Maternity leave: discussed. Cigarette smoking: never smoked. No orders of the defined types were placed in this encounter.   No orders of the defined types were placed in this encounter.   Follow up in 1 Week.

## 2016-01-15 LAB — NUSWAB VAGINITIS PLUS (VG+)
CANDIDA ALBICANS, NAA: NEGATIVE
CANDIDA GLABRATA, NAA: NEGATIVE
CHLAMYDIA TRACHOMATIS, NAA: NEGATIVE
NEISSERIA GONORRHOEAE, NAA: NEGATIVE
TRICH VAG BY NAA: NEGATIVE

## 2016-01-15 LAB — STREP GP B NAA: Strep Gp B NAA: NEGATIVE

## 2016-01-17 ENCOUNTER — Inpatient Hospital Stay (HOSPITAL_COMMUNITY)
Admission: AD | Admit: 2016-01-17 | Discharge: 2016-01-17 | Disposition: A | Discharge status: Home / Self Care | Payer: BLUE CROSS/BLUE SHIELD | Source: Ambulatory Visit | Attending: Obstetrics | Admitting: Obstetrics

## 2016-01-17 ENCOUNTER — Encounter (HOSPITAL_COMMUNITY): Payer: Self-pay | Admitting: *Deleted

## 2016-01-17 DIAGNOSIS — N949 Unspecified condition associated with female genital organs and menstrual cycle: Secondary | ICD-10-CM

## 2016-01-17 DIAGNOSIS — O9989 Other specified diseases and conditions complicating pregnancy, childbirth and the puerperium: Secondary | ICD-10-CM

## 2016-01-17 DIAGNOSIS — O26893 Other specified pregnancy related conditions, third trimester: Secondary | ICD-10-CM | POA: Insufficient documentation

## 2016-01-17 DIAGNOSIS — R102 Pelvic and perineal pain: Secondary | ICD-10-CM | POA: Diagnosis not present

## 2016-01-17 DIAGNOSIS — Z3A36 36 weeks gestation of pregnancy: Secondary | ICD-10-CM | POA: Insufficient documentation

## 2016-01-17 DIAGNOSIS — O26899 Other specified pregnancy related conditions, unspecified trimester: Secondary | ICD-10-CM

## 2016-01-17 LAB — URINALYSIS, ROUTINE W REFLEX MICROSCOPIC
Bilirubin Urine: NEGATIVE
GLUCOSE, UA: NEGATIVE mg/dL
HGB URINE DIPSTICK: NEGATIVE
Ketones, ur: NEGATIVE mg/dL
Leukocytes, UA: NEGATIVE
Nitrite: NEGATIVE
PROTEIN: NEGATIVE mg/dL
Specific Gravity, Urine: <1.005 — ABNORMAL LOW (ref 1.005–1.030)
pH: 5.5 (ref 5.0–8.0)

## 2016-01-17 NOTE — Discharge Instructions (Signed)
Round Ligament Pain  The round ligament is a cord of muscle and tissue that helps to support the uterus. It can become a source of pain during pregnancy if it becomes stretched or twisted as the baby grows. The pain usually begins in the second trimester of pregnancy, and it can come and go until the baby is delivered. It is not a serious problem, and it does not cause harm to the baby.  Round ligament pain is usually a short, sharp, and pinching pain, but it can also be a dull, lingering, and aching pain. The pain is felt in the lower side of the abdomen or in the groin. It usually starts deep in the groin and moves up to the outside of the hip area. Pain can occur with:   A sudden change in position.   Rolling over in bed.   Coughing or sneezing.   Physical activity.  HOME CARE INSTRUCTIONS  Watch your condition for any changes. Take these steps to help with your pain:   When the pain starts, relax. Then try:    Sitting down.    Flexing your knees up to your abdomen.    Lying on your side with one pillow under your abdomen and another pillow between your legs.    Sitting in a warm bath for 15-20 minutes or until the pain goes away.   Take over-the-counter and prescription medicines only as told by your health care provider.   Move slowly when you sit and stand.   Avoid long walks if they cause pain.   Stop or lessen your physical activities if they cause pain.  SEEK MEDICAL CARE IF:   Your pain does not go away with treatment.   You feel pain in your back that you did not have before.   Your medicine is not helping.  SEEK IMMEDIATE MEDICAL CARE IF:   You develop a fever or chills.   You develop uterine contractions.   You develop vaginal bleeding.   You develop nausea or vomiting.   You develop diarrhea.   You have pain when you urinate.     This information is not intended to replace advice given to you by your health care provider. Make sure you discuss any questions you have with your health  care provider.     Document Released: 07/25/2008 Document Revised: 01/08/2012 Document Reviewed: 12/23/2014  Elsevier Interactive Patient Education 2016 [employer]

## 2016-01-17 NOTE — MAU Provider Note (Signed)
History   G1 @ 36.3 wks in with c/o left lower quad pain since yesterday. States pain is dull pulling pain but with fetal movement pain is sharp.  CSN: [id number]  Arrival date & time 01/17/16  1724   None     Chief Complaint  Patient presents with  . Abdominal Pain    HPI  Past Medical History  Diagnosis Date  . Medical history non-contributory   . Bell's palsy     Past Surgical History  Procedure Laterality Date  . No past surgeries      Family History  Problem Relation Age of Onset  . Hyperlipidemia Father   . Diabetes Paternal Grandfather   . Bipolar disorder Mother     Social History  Substance Use Topics  . Smoking status: Never Smoker   . Smokeless tobacco: None  . Alcohol Use: No    OB History    Gravida Para Term Preterm AB TAB SAB Ectopic Multiple Living   1               Review of Systems  Constitutional: Negative.   HENT: Negative.   Eyes: Negative.   Respiratory: Negative.   Cardiovascular: Negative.   Gastrointestinal: Positive for abdominal pain.  Endocrine: Negative.   Genitourinary: Negative.   Musculoskeletal: Negative.   Skin: Negative.   Allergic/Immunologic: Negative.   Neurological: Negative.   Hematological: Negative.   Psychiatric/Behavioral: Negative.     Allergies  Review of patient's allergies indicates no known allergies.  Home Medications  No current outpatient prescriptions on file.  BP 135/80 mmHg  Pulse 120  Temp(Src) 98.5 F (36.9 C)  Resp 18  SpO2 100%  LMP 05/07/2015  Physical Exam  Constitutional: She is oriented to person, place, and time. She appears well-developed and well-nourished.  HENT:  Head: Normocephalic.  Eyes: Pupils are equal, round, and reactive to light.  Neck: Normal range of motion.  Cardiovascular: Normal rate, regular rhythm, normal heart sounds and intact distal pulses.   Pulmonary/Chest: Effort normal and breath sounds normal.  Abdominal: Soft. Bowel sounds are normal.   Genitourinary: Vagina normal and uterus normal.  Musculoskeletal: Normal range of motion.  Neurological: She is alert and oriented to person, place, and time. She has normal reflexes.  Skin: Skin is warm and dry.  Psychiatric: She has a normal mood and affect. Her behavior is normal. Judgment and thought content normal.    MAU Course  Procedures (including critical care time)  Labs Reviewed  URINALYSIS, ROUTINE W REFLEX MICROSCOPIC (NOT AT [facility])   No results found.   No diagnosis found.    MDM  FHR pattern reassuring,  Round lig pain will d/c home with reactive FHR pattern.

## 2016-01-17 NOTE — MAU Note (Signed)
Patient started having "round ligament pain on my right side this morning that never goes away."  Denies LOF or vaginal bleeding.  Reports +fetal movement.

## 2016-01-21 ENCOUNTER — Ambulatory Visit (INDEPENDENT_AMBULATORY_CARE_PROVIDER_SITE_OTHER): Payer: BLUE CROSS/BLUE SHIELD | Admitting: Certified Nurse Midwife

## 2016-01-21 VITALS — BP 121/83 | HR 109 | Temp 98.6°F | Wt 218.0 lb

## 2016-01-21 DIAGNOSIS — Z3403 Encounter for supervision of normal first pregnancy, third trimester: Secondary | ICD-10-CM

## 2016-01-21 LAB — POCT URINALYSIS DIPSTICK
BILIRUBIN UA: NEGATIVE
Blood, UA: NEGATIVE
GLUCOSE UA: NEGATIVE
Ketones, UA: NEGATIVE
Leukocytes, UA: NEGATIVE
Nitrite, UA: NEGATIVE
Protein, UA: NEGATIVE
SPEC GRAV UA: 1.02
UROBILINOGEN UA: NEGATIVE
pH, UA: 6

## 2016-01-21 NOTE — Progress Notes (Signed)
Subjective:    Jacqueline Perkins is a 20 y.o. female being seen today for her obstetrical visit. She is at 376w0d gestation. Patient reports no bleeding, no leaking and occasional contractions. Fetal movement: normal.  Problem List Items Addressed This Visit    None    Visit Diagnoses    Encounter for supervision of normal first pregnancy in third trimester    -  Primary    Relevant Orders    POCT urinalysis dipstick      Patient Active Problem List   Diagnosis Date Noted  . Supervision of normal first pregnancy 07/28/2015    Objective:    BP 121/83 mmHg  Pulse 109  Temp(Src) 98.6 F (37 C)  Wt 218 lb (98.884 kg)  LMP 05/07/2015 FHT: 125 BPM  Uterine Size: size equals dates  Presentations: cephalic  Pelvic Exam:              Dilation: 0.5 cm       Effacement: Long             Station:  -3    Consistency: soft            Position: posterior     Assessment:    Pregnancy @ 336w0d weeks   Plan:   Plans for delivery: Vaginal anticipated; labs reviewed; problem list updated Counseling: Consent signed. Infant feeding: plans to breastfeed. Cigarette smoking: never smoked. L&D discussion: symptoms of labor, discussed when to call, discussed what number to call, anesthetic/analgesic options reviewed and delivering clinician:  plans Certified Nurse-Midwife. Postpartum supports and preparation: circumcision discussed and contraception plans discussed.  Follow up in 1 Week.

## 2016-01-28 ENCOUNTER — Ambulatory Visit (INDEPENDENT_AMBULATORY_CARE_PROVIDER_SITE_OTHER): Payer: BLUE CROSS/BLUE SHIELD | Admitting: Certified Nurse Midwife

## 2016-01-28 VITALS — BP 121/79 | HR 104 | Temp 98.1°F | Wt 218.0 lb

## 2016-01-28 DIAGNOSIS — Z3403 Encounter for supervision of normal first pregnancy, third trimester: Secondary | ICD-10-CM

## 2016-01-28 LAB — POCT URINALYSIS DIPSTICK
BILIRUBIN UA: NEGATIVE
Glucose, UA: NEGATIVE
KETONES UA: NEGATIVE
Leukocytes, UA: NEGATIVE
Nitrite, UA: NEGATIVE
PH UA: 7.5
PROTEIN UA: NEGATIVE
RBC UA: NEGATIVE
SPEC GRAV UA: 1.01
Urobilinogen, UA: NEGATIVE

## 2016-01-28 NOTE — Progress Notes (Signed)
Subjective:    Jacqueline PeersCassandra Perkins is a 20 y.o. female being seen today for her obstetrical visit. She is at 5348w0d gestation. Patient reports backache, no bleeding, no leaking, occasional contractions and reports five hours of contractions overnight, nothing this AM.   Fetal movement: normal.  Problem List Items Addressed This Visit    None     Patient Active Problem List   Diagnosis Date Noted  . Supervision of normal first pregnancy 07/28/2015    Objective:    BP 121/79 mmHg  Pulse 104  Temp(Src) 98.1 F (36.7 C)  Wt 218 lb (98.884 kg)  LMP 05/07/2015 FHT: 145 BPM  Uterine Size: size equals dates  Presentations: cephalic  Pelvic Exam:              Dilation: closed inner os, outer os 1 cm       Effacement: Long             Station:  -3    Consistency: soft            Position: posterior     Assessment:    Pregnancy @ 2948w0d weeks   doing well, normal pregnancy discomforts  Plan:   Plans for delivery: Vaginal anticipated; labs reviewed; problem list updated Counseling: Consent signed. Infant feeding: plans to breastfeed. Cigarette smoking: never smoked. L&D discussion: symptoms of labor, discussed when to call, discussed what number to call, anesthetic/analgesic options reviewed and delivering clinician:  plans Certified Nurse-Midwife. Postpartum supports and preparation: circumcision discussed and contraception plans discussed.  Follow up in 1 Week.

## 2016-01-28 NOTE — Addendum Note (Signed)
Addended by: Henriette CombsHATTON, Izac Faulkenberry L on: 01/28/2016 04:51 PM   Modules accepted: Orders

## 2016-02-04 ENCOUNTER — Ambulatory Visit (INDEPENDENT_AMBULATORY_CARE_PROVIDER_SITE_OTHER): Payer: BLUE CROSS/BLUE SHIELD | Admitting: Certified Nurse Midwife

## 2016-02-04 VITALS — BP 132/88 | HR 119 | Temp 98.2°F | Wt 228.0 lb

## 2016-02-04 DIAGNOSIS — O26843 Uterine size-date discrepancy, third trimester: Secondary | ICD-10-CM

## 2016-02-04 DIAGNOSIS — Z3403 Encounter for supervision of normal first pregnancy, third trimester: Secondary | ICD-10-CM

## 2016-02-04 LAB — POCT URINALYSIS DIPSTICK
BILIRUBIN UA: NEGATIVE
Blood, UA: NEGATIVE
GLUCOSE UA: NEGATIVE
Ketones, UA: NEGATIVE
Nitrite, UA: NEGATIVE
Protein, UA: NEGATIVE
Spec Grav, UA: 1.015
Urobilinogen, UA: NEGATIVE
pH, UA: 7

## 2016-02-04 NOTE — Progress Notes (Signed)
Patient  Concerned about recent contractions causing pain in chest (like a panic attack). Increased swelling in hands after walking.

## 2016-02-04 NOTE — Progress Notes (Signed)
Subjective:    Jacqueline PeersCassandra Perkins is a 20 y.o. female being seen today for her obstetrical visit. She is at 6154w0d gestation. Patient reports backache, no bleeding, no leaking and occasional contractions. Fetal movement: normal.  Problem List Items Addressed This Visit      Other   Supervision of normal first pregnancy - Primary   Relevant Orders   POCT urinalysis dipstick (Completed)    Other Visit Diagnoses    Uterine size date discrepancy, third trimester        Relevant Orders    US MFM OB FOLLOW UP      Patient Active Problem List   Diagnosis Date Noted  . Supervision of normal first pregnancy 07/28/2015    Objective:    BP 132/88 mmHg  Pulse 119  Temp(Src) 98.2 F (36.8 C)  Wt 228 lb (103.42 kg)  LMP 05/07/2015 FHT: 145 BPM  Uterine Size: size greater than dates  Presentations: cephalic  Pelvic Exam:              Dilation: 1cm       Effacement: Long             Station:  Floating    Consistency: soft            Position: anterior     Assessment:    Pregnancy @ 5554w0d weeks   S>D  Plan:    Discussed possible IOL. Plans for delivery: Vaginal anticipated; labs reviewed; problem list updated Counseling: Consent signed. Infant feeding: plans to breastfeed. Cigarette smoking: never smoked. L&D discussion: symptoms of labor, discussed when to call, discussed what number to call, anesthetic/analgesic options reviewed and delivering clinician:  plans Certified Nurse-Midwife. Postpartum supports and preparation: circumcision discussed and contraception plans discussed.  Follow up in 1 Week with NST.

## 2016-02-09 ENCOUNTER — Encounter (HOSPITAL_COMMUNITY): Payer: Self-pay

## 2016-02-09 ENCOUNTER — Other Ambulatory Visit: Payer: Self-pay | Admitting: Certified Nurse Midwife

## 2016-02-09 ENCOUNTER — Ambulatory Visit (HOSPITAL_COMMUNITY)
Admission: RE | Admit: 2016-02-09 | Discharge: 2016-02-09 | Disposition: A | Discharge status: Home / Self Care | Payer: BLUE CROSS/BLUE SHIELD | Source: Ambulatory Visit | Attending: Certified Nurse Midwife | Admitting: Certified Nurse Midwife

## 2016-02-09 DIAGNOSIS — Z3A39 39 weeks gestation of pregnancy: Secondary | ICD-10-CM | POA: Diagnosis not present

## 2016-02-09 DIAGNOSIS — O26843 Uterine size-date discrepancy, third trimester: Secondary | ICD-10-CM

## 2016-02-09 DIAGNOSIS — O99213 Obesity complicating pregnancy, third trimester: Secondary | ICD-10-CM | POA: Insufficient documentation

## 2016-02-11 ENCOUNTER — Ambulatory Visit (INDEPENDENT_AMBULATORY_CARE_PROVIDER_SITE_OTHER): Payer: BLUE CROSS/BLUE SHIELD | Admitting: Certified Nurse Midwife

## 2016-02-11 ENCOUNTER — Telehealth (HOSPITAL_COMMUNITY): Payer: Self-pay | Admitting: *Deleted

## 2016-02-11 VITALS — BP 133/84 | HR 93 | Temp 98.9°F | Wt 226.0 lb

## 2016-02-11 DIAGNOSIS — Z3403 Encounter for supervision of normal first pregnancy, third trimester: Secondary | ICD-10-CM

## 2016-02-11 LAB — POCT URINALYSIS DIPSTICK
Bilirubin, UA: NEGATIVE
Blood, UA: NEGATIVE
Glucose, UA: NEGATIVE
KETONES UA: NEGATIVE
LEUKOCYTES UA: NEGATIVE
Nitrite, UA: NEGATIVE
PH UA: 7
PROTEIN UA: NEGATIVE
UROBILINOGEN UA: NEGATIVE

## 2016-02-11 NOTE — Telephone Encounter (Signed)
Preadmission screen  

## 2016-02-11 NOTE — Progress Notes (Signed)
Subjective:    Jacqueline Perkins is a 20 y.o. female being seen today for her obstetrical visit. She is at 7120w0d gestation. Patient reports backache, no bleeding, no leaking and occasional contractions. Fetal movement: normal.  Problem List Items Addressed This Visit      Other   Supervision of normal first pregnancy - Primary   Relevant Orders   POCT urinalysis dipstick (Completed)     Patient Active Problem List   Diagnosis Date Noted  . Supervision of normal first pregnancy 07/28/2015    Objective:    BP 133/84 mmHg  Pulse 93  Temp(Src) 98.9 F (37.2 C)  Wt 226 lb (102.513 kg)  LMP 05/07/2015 FHT:  133 BPM  Uterine Size: size greater than dates  Presentation: cephalic  Pelvic Exam: deferred     Assessment:    Pregnancy @ 5020w0d  weeks   Suspected LGA infant with EFW of 90% Plan:    Postdates management: discussed fetal surveillance and induction, discussed fetal movement. Induction: scheduled for 02/15/16, written information given.  Follow up postpartum.

## 2016-02-11 NOTE — Progress Notes (Signed)
Patient is feeling more contractions Just ready for the baby to come. And discuss the growth scan.

## 2016-02-15 ENCOUNTER — Inpatient Hospital Stay (HOSPITAL_COMMUNITY)
Admission: RE | Admit: 2016-02-15 | Discharge: 2016-02-19 | DRG: 775 | Disposition: A | Discharge status: Home / Self Care | Payer: BLUE CROSS/BLUE SHIELD | Source: Ambulatory Visit | Attending: Obstetrics | Admitting: Obstetrics

## 2016-02-15 ENCOUNTER — Encounter (HOSPITAL_COMMUNITY): Payer: Self-pay

## 2016-02-15 DIAGNOSIS — O3663X0 Maternal care for excessive fetal growth, third trimester, not applicable or unspecified: Secondary | ICD-10-CM | POA: Diagnosis present

## 2016-02-15 DIAGNOSIS — O9962 Diseases of the digestive system complicating childbirth: Secondary | ICD-10-CM | POA: Diagnosis present

## 2016-02-15 DIAGNOSIS — Z833 Family history of diabetes mellitus: Secondary | ICD-10-CM

## 2016-02-15 DIAGNOSIS — K219 Gastro-esophageal reflux disease without esophagitis: Secondary | ICD-10-CM | POA: Diagnosis present

## 2016-02-15 DIAGNOSIS — Z818 Family history of other mental and behavioral disorders: Secondary | ICD-10-CM

## 2016-02-15 DIAGNOSIS — Z3A40 40 weeks gestation of pregnancy: Secondary | ICD-10-CM | POA: Diagnosis not present

## 2016-02-15 DIAGNOSIS — O480 Post-term pregnancy: Principal | ICD-10-CM | POA: Diagnosis present

## 2016-02-15 DIAGNOSIS — IMO0002 Reserved for concepts with insufficient information to code with codable children: Secondary | ICD-10-CM | POA: Diagnosis present

## 2016-02-15 HISTORY — DX: Cardiac murmur, unspecified: R01.1

## 2016-02-15 LAB — CBC
HEMATOCRIT: 35.4 % — AB (ref 36.0–46.0)
Hemoglobin: 12.2 g/dL (ref 12.0–15.0)
MCH: 28 pg (ref 26.0–34.0)
MCHC: 34.5 g/dL (ref 30.0–36.0)
MCV: 81.4 fL (ref 78.0–100.0)
Platelets: 311 10*3/uL (ref 150–400)
RBC: 4.35 MIL/uL (ref 3.87–5.11)
RDW: 14.9 % (ref 11.5–15.5)
WBC: 13.3 10*3/uL — ABNORMAL HIGH (ref 4.0–10.5)

## 2016-02-15 LAB — URINALYSIS, ROUTINE W REFLEX MICROSCOPIC
BILIRUBIN URINE: NEGATIVE
GLUCOSE, UA: NEGATIVE mg/dL
Hgb urine dipstick: NEGATIVE
KETONES UR: NEGATIVE mg/dL
Nitrite: NEGATIVE
PH: 7 (ref 5.0–8.0)
Protein, ur: NEGATIVE mg/dL
Specific Gravity, Urine: <1.005 — ABNORMAL LOW (ref 1.005–1.030)

## 2016-02-15 LAB — RAPID URINE DRUG SCREEN, HOSP PERFORMED
AMPHETAMINES: NOT DETECTED
BENZODIAZEPINES: NOT DETECTED
Barbiturates: NOT DETECTED
COCAINE: NOT DETECTED
OPIATES: NOT DETECTED
Tetrahydrocannabinol: NOT DETECTED

## 2016-02-15 LAB — COMPREHENSIVE METABOLIC PANEL
ALK PHOS: 150 U/L — AB (ref 38–126)
ALT: 18 U/L (ref 14–54)
AST: 23 U/L (ref 15–41)
Albumin: 3.2 g/dL — ABNORMAL LOW (ref 3.5–5.0)
Anion gap: 7 (ref 5–15)
BILIRUBIN TOTAL: 0.4 mg/dL (ref 0.3–1.2)
BUN: 8 mg/dL (ref 6–20)
CALCIUM: 9.3 mg/dL (ref 8.9–10.3)
CHLORIDE: 107 mmol/L (ref 101–111)
CO2: 23 mmol/L (ref 22–32)
CREATININE: 0.68 mg/dL (ref 0.44–1.00)
GFR calc Af Amer: >60 mL/min (ref >60)
Glucose, Bld: 83 mg/dL (ref 65–99)
Potassium: 3.9 mmol/L (ref 3.5–5.1)
Sodium: 137 mmol/L (ref 135–145)
TOTAL PROTEIN: 6.4 g/dL — AB (ref 6.5–8.1)

## 2016-02-15 LAB — URINE MICROSCOPIC-ADD ON

## 2016-02-15 LAB — RPR: RPR Ser Ql: NONREACTIVE

## 2016-02-15 MED ORDER — OXYCODONE-ACETAMINOPHEN 5-325 MG PO TABS: 1.0000 | ORAL_TABLET | ORAL | Status: DC | PRN

## 2016-02-15 MED ORDER — TERBUTALINE SULFATE 1 MG/ML IJ SOLN
0.2500 mg | Freq: Once | INTRAMUSCULAR | Status: DC | PRN
  Filled 2016-02-15: qty 1

## 2016-02-15 MED ORDER — ACETAMINOPHEN 325 MG PO TABS: 650.0000 mg | ORAL_TABLET | ORAL | Status: DC | PRN

## 2016-02-15 MED ORDER — OXYTOCIN 10 UNIT/ML IJ SOLN: 10.0000 [IU] | Freq: Once | INTRAMUSCULAR | Status: DC

## 2016-02-15 MED ORDER — FENTANYL CITRATE (PF) 100 MCG/2ML IJ SOLN
100.0000 ug | INTRAMUSCULAR | Status: DC | PRN
Start: 2016-02-15 — End: 2016-02-17

## 2016-02-15 MED ORDER — CITRIC ACID-SODIUM CITRATE 334-500 MG/5ML PO SOLN: 30.0000 mL | ORAL | Status: DC | PRN

## 2016-02-15 MED ORDER — OXYTOCIN BOLUS FROM INFUSION
500.0000 mL | INTRAVENOUS | Status: DC
  Administered 2016-02-17: 500 mL via INTRAVENOUS

## 2016-02-15 MED ORDER — ONDANSETRON HCL 4 MG/2ML IJ SOLN
4.0000 mg | Freq: Four times a day (QID) | INTRAMUSCULAR | Status: DC | PRN
  Administered 2016-02-16 – 2016-02-17 (×3): 4 mg via INTRAVENOUS
  Filled 2016-02-15 (×3): qty 2

## 2016-02-15 MED ORDER — LIDOCAINE HCL (PF) 1 % IJ SOLN
30.0000 mL | INTRAMUSCULAR | Status: DC | PRN
  Administered 2016-02-17: 30 mL via SUBCUTANEOUS
  Filled 2016-02-15: qty 30

## 2016-02-15 MED ORDER — MISOPROSTOL 200 MCG PO TABS
50.0000 ug | ORAL_TABLET | ORAL | Status: DC
  Administered 2016-02-15 – 2016-02-16 (×5): 50 ug via ORAL
  Filled 2016-02-15 (×5): qty 0.5

## 2016-02-15 MED ORDER — FLEET ENEMA 7-19 GM/118ML RE ENEM: 1.0000 | ENEMA | Freq: Every day | RECTAL | Status: DC | PRN

## 2016-02-15 MED ORDER — LACTATED RINGERS IV SOLN: 500.0000 mL | INTRAVENOUS | Status: DC | PRN

## 2016-02-15 MED ORDER — LACTATED RINGERS IV SOLN
INTRAVENOUS | Status: DC
  Administered 2016-02-15 – 2016-02-17 (×6): via INTRAVENOUS

## 2016-02-15 MED ORDER — OXYTOCIN 10 UNIT/ML IJ SOLN
2.5000 [IU]/h | INTRAMUSCULAR | Status: DC
  Administered 2016-02-17: 2.5 [IU]/h via INTRAVENOUS
  Filled 2016-02-15: qty 4

## 2016-02-15 MED ORDER — ZOLPIDEM TARTRATE 5 MG PO TABS
5.0000 mg | ORAL_TABLET | Freq: Every day | ORAL | Status: DC
  Administered 2016-02-15 – 2016-02-16 (×2): 5 mg via ORAL
  Filled 2016-02-15 (×2): qty 1

## 2016-02-15 MED ORDER — OXYCODONE-ACETAMINOPHEN 5-325 MG PO TABS: 2.0000 | ORAL_TABLET | ORAL | Status: DC | PRN

## 2016-02-15 NOTE — H&P (Signed)
Jacqueline PeersCassandra Perkins is a 20 y.o. female presenting for IOL at term, probable LGA infant by US. History OB History    Gravida Para Term Preterm AB TAB SAB Ectopic Multiple Living   1         0     Past Medical History  Diagnosis Date  . Medical history non-contributory   . Bell's palsy   . Heart murmur    Past Surgical History  Procedure Laterality Date  . No past surgeries     Family History: family history includes Bipolar disorder in her mother; Diabetes in her paternal grandfather; Hyperlipidemia in her father. Social History:  reports that she has never smoked. She does not have any smokeless tobacco history on file. She reports that she does not drink alcohol or use illicit drugs.   Prenatal Transfer Tool  Maternal Diabetes: No Genetic Screening: Normal Maternal Ultrasounds/Referrals: Normal Fetal Ultrasounds or other Referrals:  Referred to Materal Fetal Medicine , Other: S>D Maternal Substance Abuse:  No Significant Maternal Medications:  None Significant Maternal Lab Results:  None Other Comments:  None  ROS    Blood pressure 116/71, pulse 101, temperature 98.1 F (36.7 C), temperature source Oral, resp. rate 18, height 5\' 6"  (1.676 m), weight 226 lb (102.513 kg), last menstrual period 05/07/2015. Exam Physical Exam  Prenatal labs: ABO, Rh: AB/NEG/-- (09/28 1327) Antibody: NEG (09/28 1327) Rubella: 1.25 (09/28 1327) RPR: NON REAC (01/19 1329)  HBsAg: NEGATIVE (09/28 1327)  HIV: NONREACTIVE (01/19 1329)  GBS: Negative (03/16 1335)   Assessment/Plan: Admit. Cytotec   Jacqueline Perkins, CNM 02/15/2016, 8:18 AM

## 2016-02-16 ENCOUNTER — Inpatient Hospital Stay (HOSPITAL_COMMUNITY): Payer: BLUE CROSS/BLUE SHIELD | Admitting: Anesthesiology

## 2016-02-16 ENCOUNTER — Encounter (HOSPITAL_COMMUNITY): Payer: Self-pay

## 2016-02-16 LAB — CBC
HCT: 35.9 % — ABNORMAL LOW (ref 36.0–46.0)
HEMOGLOBIN: 12.2 g/dL (ref 12.0–15.0)
MCH: 27.7 pg (ref 26.0–34.0)
MCHC: 34 g/dL (ref 30.0–36.0)
MCV: 81.6 fL (ref 78.0–100.0)
PLATELETS: 264 10*3/uL (ref 150–400)
RBC: 4.4 MIL/uL (ref 3.87–5.11)
RDW: 14.6 % (ref 11.5–15.5)
WBC: 18.8 10*3/uL — ABNORMAL HIGH (ref 4.0–10.5)

## 2016-02-16 MED ORDER — OXYTOCIN 10 UNIT/ML IJ SOLN
1.0000 m[IU]/min | INTRAVENOUS | Status: DC
  Administered 2016-02-16 – 2016-02-17 (×2): 2 m[IU]/min via INTRAVENOUS
  Filled 2016-02-16: qty 4

## 2016-02-16 MED ORDER — PHENYLEPHRINE 40 MCG/ML (10ML) SYRINGE FOR IV PUSH (FOR BLOOD PRESSURE SUPPORT)
80.0000 ug | PREFILLED_SYRINGE | INTRAVENOUS | Status: DC | PRN
  Filled 2016-02-16: qty 2

## 2016-02-16 MED ORDER — EPHEDRINE 5 MG/ML INJ
10.0000 mg | INTRAVENOUS | Status: DC | PRN
  Filled 2016-02-16: qty 2

## 2016-02-16 MED ORDER — LIDOCAINE HCL (PF) 1 % IJ SOLN
INTRAMUSCULAR | Status: DC | PRN
  Administered 2016-02-16 (×2): 4 mL via EPIDURAL

## 2016-02-16 MED ORDER — MISOPROSTOL 25 MCG QUARTER TABLET
25.0000 ug | ORAL_TABLET | ORAL | Status: DC
  Administered 2016-02-16 – 2016-02-17 (×3): 25 ug via VAGINAL
  Filled 2016-02-16: qty 1
  Filled 2016-02-16: qty 0.25
  Filled 2016-02-16: qty 1
  Filled 2016-02-16: qty 0.25
  Filled 2016-02-16 (×3): qty 1
  Filled 2016-02-16: qty 0.25

## 2016-02-16 MED ORDER — TERBUTALINE SULFATE 1 MG/ML IJ SOLN
0.2500 mg | Freq: Once | INTRAMUSCULAR | Status: DC | PRN
  Filled 2016-02-16: qty 1

## 2016-02-16 MED ORDER — PHENYLEPHRINE 40 MCG/ML (10ML) SYRINGE FOR IV PUSH (FOR BLOOD PRESSURE SUPPORT)
80.0000 ug | PREFILLED_SYRINGE | INTRAVENOUS | Status: DC | PRN
  Filled 2016-02-16: qty 20
  Filled 2016-02-16: qty 2

## 2016-02-16 MED ORDER — LACTATED RINGERS IV SOLN: 500.0000 mL | Freq: Once | INTRAVENOUS | Status: DC

## 2016-02-16 MED ORDER — PHENYLEPHRINE 40 MCG/ML (10ML) SYRINGE FOR IV PUSH (FOR BLOOD PRESSURE SUPPORT)
PREFILLED_SYRINGE | INTRAVENOUS | Status: AC
  Filled 2016-02-16: qty 20

## 2016-02-16 MED ORDER — FENTANYL 2.5 MCG/ML BUPIVACAINE 1/10 % EPIDURAL INFUSION (WH - ANES)
14.0000 mL/h | INTRAMUSCULAR | Status: DC | PRN
  Administered 2016-02-16 – 2016-02-17 (×5): 14 mL/h via EPIDURAL
  Filled 2016-02-16 (×4): qty 125

## 2016-02-16 MED ORDER — DIPHENHYDRAMINE HCL 50 MG/ML IJ SOLN: 12.5000 mg | INTRAMUSCULAR | Status: DC | PRN

## 2016-02-16 MED ORDER — PROMETHAZINE HCL 25 MG PO TABS
25.0000 mg | ORAL_TABLET | Freq: Four times a day (QID) | ORAL | Status: DC | PRN
  Administered 2016-02-16: 25 mg via ORAL
  Filled 2016-02-16: qty 1

## 2016-02-16 NOTE — Progress Notes (Signed)
Jacqueline PeersCassandra Hellstrom is a 20 y.o. G1P0 at 2114w5d by ultrasound admitted for induction of labor due to Post dates. Due date 02/11/16.  Subjective: No complaints.  Was not able to sleep overnight.  Is handling contractions well.  States that she is planning an epidural later.  Discussed labor management plans.  Patient and spouse verbalized understanding.   Objective: BP 119/76 mmHg  Pulse 87  Temp(Src) 97.8 F (36.6 C) (Oral)  Resp 18  Ht 5\' 6"  (1.676 m)  Wt 226 lb (102.513 kg)  BMI 36.49 kg/m2  LMP 05/07/2015      FHT:  FHR: 135 bpm, variability: moderate,  accelerations:  Present,  decelerations:  Absent UC:   regular, every 3-5 minutes SVE:   Dilation: 3 Effacement (%): Thick Station: -3 Exam by:: M.Merrill, RN  Labs: Lab Results  Component Value Date   WBC 18.8* 02/16/2016   HGB 12.2 02/16/2016   HCT 35.9* 02/16/2016   MCV 81.6 02/16/2016   PLT 264 02/16/2016    Assessment / Plan: Induction of labor due to postterm,  progressing well on pitocin  Labor: Early latent labor Preeclampsia:  no signs or symptoms of toxicity Fetal Wellbeing:  Category I Pain Control:  Labor support without medications and IV pain meds ordered I/D:  n/a Anticipated MOD:  NSVD and guarded aware of LGA status  Roe CoombsRachelle A Denney, CNM 02/16/2016, 8:31 AM

## 2016-02-16 NOTE — Anesthesia Preprocedure Evaluation (Signed)
Anesthesia Evaluation  Patient identified by MRN, date of birth, ID band Patient awake    Reviewed: Allergy & Precautions, NPO status , Patient's Chart, lab work & pertinent test results  Airway Mallampati: III  TM Distance: >3 FB Neck ROM: Full    Dental no notable dental hx. (+) Teeth Intact   Pulmonary neg pulmonary ROS,    Pulmonary exam normal breath sounds clear to auscultation       Cardiovascular negative cardio ROS Normal cardiovascular exam+ Valvular Problems/Murmurs  Rhythm:Regular Rate:Normal     Neuro/Psych  Neuromuscular disease negative psych ROS   GI/Hepatic Neg liver ROS, GERD  ,  Endo/Other  Obesity  Renal/GU negative Renal ROS  negative genitourinary   Musculoskeletal negative musculoskeletal ROS (+)   Abdominal   Peds  Hematology negative hematology ROS (+)   Anesthesia Other Findings   Reproductive/Obstetrics (+) Pregnancy                             Anesthesia Physical Anesthesia Plan  ASA: II  Anesthesia Plan: Epidural   Post-op Pain Management:    Induction:   Airway Management Planned: Natural Airway  Additional Equipment:   Intra-op Plan:   Post-operative Plan:   Informed Consent: I have reviewed the patients History and Physical, chart, labs and discussed the procedure including the risks, benefits and alternatives for the proposed anesthesia with the patient or authorized representative who has indicated his/her understanding and acceptance.     Plan Discussed with: Anesthesiologist  Anesthesia Plan Comments:         Anesthesia Quick Evaluation

## 2016-02-16 NOTE — Anesthesia Procedure Notes (Signed)
Epidural Patient location during procedure: OB Start time: 02/16/2016 11:49 AM  Staffing Anesthesiologist: Mal AmabileFOSTER, Tiarrah Saville Performed by: anesthesiologist   Preanesthetic Checklist Completed: patient identified, site marked, surgical consent, pre-op evaluation, timeout performed, IV checked, risks and benefits discussed and monitors and equipment checked  Epidural Patient position: sitting Prep: site prepped and draped and DuraPrep Patient monitoring: continuous pulse ox and blood pressure Approach: midline Location: L3-L4 Injection technique: LOR air  Needle:  Needle type: Tuohy  Needle gauge: 17 G Needle length: 9 cm and 9 Needle insertion depth: 5 cm cm Catheter type: closed end flexible Catheter size: 19 Gauge Catheter at skin depth: 10 cm Test dose: negative and Other  Assessment Events: blood not aspirated, injection not painful, no injection resistance, negative IV test and no paresthesia  Additional Notes Patient identified. Risks and benefits discussed including failed block, incomplete  Pain control, post dural puncture headache, nerve damage, paralysis, blood pressure Changes, nausea, vomiting, reactions to medications-both toxic and allergic and post Partum back pain. All questions were answered. Patient expressed understanding and wished to proceed. Sterile technique was used throughout procedure. Epidural site was Dressed with sterile barrier dressing. No paresthesias, signs of intravascular injection Or signs of intrathecal spread were encountered.  Patient was more comfortable after the epidural was dosed. Please see RN's note for documentation of vital signs and FHR which are stable.

## 2016-02-17 ENCOUNTER — Encounter (HOSPITAL_COMMUNITY): Payer: Self-pay

## 2016-02-17 MED ORDER — MEDROXYPROGESTERONE ACETATE 150 MG/ML IM SUSP: 150.0000 mg | INTRAMUSCULAR | Status: DC | PRN

## 2016-02-17 MED ORDER — FLEET ENEMA 7-19 GM/118ML RE ENEM: 1.0000 | ENEMA | Freq: Every day | RECTAL | Status: DC | PRN

## 2016-02-17 MED ORDER — ONDANSETRON HCL 4 MG PO TABS: 4.0000 mg | ORAL_TABLET | ORAL | Status: DC | PRN

## 2016-02-17 MED ORDER — MISOPROSTOL 200 MCG PO TABS
ORAL_TABLET | ORAL | Status: AC
  Filled 2016-02-17: qty 5

## 2016-02-17 MED ORDER — BENZOCAINE-MENTHOL 20-0.5 % EX AERO: 1.0000 "application " | INHALATION_SPRAY | CUTANEOUS | Status: DC | PRN

## 2016-02-17 MED ORDER — DIPHENHYDRAMINE HCL 25 MG PO CAPS: 25.0000 mg | ORAL_CAPSULE | Freq: Four times a day (QID) | ORAL | Status: DC | PRN

## 2016-02-17 MED ORDER — METHYLERGONOVINE MALEATE 0.2 MG/ML IJ SOLN
0.2000 mg | Freq: Once | INTRAMUSCULAR | Status: AC
  Administered 2016-02-17: 0.2 mg via INTRAMUSCULAR

## 2016-02-17 MED ORDER — IBUPROFEN 600 MG PO TABS
600.0000 mg | ORAL_TABLET | Freq: Four times a day (QID) | ORAL | Status: DC
  Administered 2016-02-17 – 2016-02-19 (×7): 600 mg via ORAL
  Filled 2016-02-17 (×7): qty 1

## 2016-02-17 MED ORDER — WITCH HAZEL-GLYCERIN EX PADS: 1.0000 "application " | MEDICATED_PAD | CUTANEOUS | Status: DC | PRN

## 2016-02-17 MED ORDER — MISOPROSTOL 200 MCG PO TABS
1000.0000 ug | ORAL_TABLET | Freq: Once | ORAL | Status: AC
  Administered 2016-02-17: 1000 ug via VAGINAL

## 2016-02-17 MED ORDER — METHYLERGONOVINE MALEATE 0.2 MG/ML IJ SOLN
INTRAMUSCULAR | Status: AC
  Filled 2016-02-17: qty 1

## 2016-02-17 MED ORDER — TETANUS-DIPHTH-ACELL PERTUSSIS 5-2.5-18.5 LF-MCG/0.5 IM SUSP: 0.5000 mL | Freq: Once | INTRAMUSCULAR | Status: DC

## 2016-02-17 MED ORDER — OXYCODONE-ACETAMINOPHEN 5-325 MG PO TABS: 1.0000 | ORAL_TABLET | ORAL | Status: DC | PRN

## 2016-02-17 MED ORDER — METHYLERGONOVINE MALEATE 0.2 MG PO TABS: 0.2000 mg | ORAL_TABLET | ORAL | Status: DC | PRN

## 2016-02-17 MED ORDER — SENNOSIDES-DOCUSATE SODIUM 8.6-50 MG PO TABS
2.0000 | ORAL_TABLET | ORAL | Status: DC
  Administered 2016-02-17: 2 via ORAL
  Filled 2016-02-17 (×2): qty 2

## 2016-02-17 MED ORDER — SIMETHICONE 80 MG PO CHEW
80.0000 mg | CHEWABLE_TABLET | ORAL | Status: DC | PRN
Start: 2016-02-17 — End: 2016-02-19

## 2016-02-17 MED ORDER — MEASLES, MUMPS & RUBELLA VAC ~~LOC~~ INJ: 0.5000 mL | INJECTION | Freq: Once | SUBCUTANEOUS | Status: DC

## 2016-02-17 MED ORDER — OXYTOCIN 10 UNIT/ML IJ SOLN: 2.5000 [IU]/h | INTRAMUSCULAR | Status: AC | PRN

## 2016-02-17 MED ORDER — OXYCODONE-ACETAMINOPHEN 5-325 MG PO TABS: 2.0000 | ORAL_TABLET | ORAL | Status: DC | PRN

## 2016-02-17 MED ORDER — DIBUCAINE 1 % RE OINT: 1.0000 "application " | TOPICAL_OINTMENT | RECTAL | Status: DC | PRN

## 2016-02-17 MED ORDER — ACETAMINOPHEN 325 MG PO TABS
650.0000 mg | ORAL_TABLET | ORAL | Status: DC | PRN
  Filled 2016-02-17: qty 2

## 2016-02-17 MED ORDER — ZOLPIDEM TARTRATE 5 MG PO TABS: 5.0000 mg | ORAL_TABLET | Freq: Every evening | ORAL | Status: DC | PRN

## 2016-02-17 MED ORDER — COCONUT OIL OIL: 1.0000 "application " | TOPICAL_OIL | Status: DC | PRN

## 2016-02-17 MED ORDER — PRENATE PIXIE 10-0.6-0.4-200 MG PO CAPS: 1.0000 | ORAL_CAPSULE | Freq: Every day | ORAL | Status: DC

## 2016-02-17 MED ORDER — FERROUS SULFATE 325 (65 FE) MG PO TABS
325.0000 mg | ORAL_TABLET | Freq: Two times a day (BID) | ORAL | Status: DC
  Administered 2016-02-18 – 2016-02-19 (×3): 325 mg via ORAL
  Filled 2016-02-17 (×3): qty 1

## 2016-02-17 MED ORDER — METHYLERGONOVINE MALEATE 0.2 MG/ML IJ SOLN: 0.2000 mg | INTRAMUSCULAR | Status: DC | PRN

## 2016-02-17 MED ORDER — ASPIRIN EFFERVESCENT 325 MG PO TBEF: 325.0000 mg | EFFERVESCENT_TABLET | Freq: Four times a day (QID) | ORAL | Status: DC | PRN

## 2016-02-17 MED ORDER — ONDANSETRON HCL 4 MG/2ML IJ SOLN: 4.0000 mg | INTRAMUSCULAR | Status: DC | PRN

## 2016-02-17 MED ORDER — BISACODYL 10 MG RE SUPP: 10.0000 mg | Freq: Every day | RECTAL | Status: DC | PRN

## 2016-02-17 NOTE — Progress Notes (Signed)
Jacqueline Perkins is a 20 y.o. G1P0 at 6080w6d by ultrasound admitted for induction of labor due to Post dates. Due date 02/11/16.  Subjective: No Complaints.    Objective: BP 110/74 mmHg  Pulse 99  Temp(Src) 98.8 F (37.1 C) (Oral)  Resp 18  Ht 5\' 6"  (1.676 m)  Wt 226 lb (102.513 kg)  BMI 36.49 kg/m2  LMP 05/07/2015 I/O last 3 completed shifts: In: -  Out: 2400 [Urine:2000; Emesis/NG output:400]    FHT:  FHR: 135 bpm, variability: moderate,  accelerations:  Present,  decelerations:  Present variable UC:   regular, every 5 minutes SVE:   Dilation: 4.5 Effacement (%): 80 Station: -2 Exam by:: awalker,rn  Labs: Lab Results  Component Value Date   WBC 18.8* 02/16/2016   HGB 12.2 02/16/2016   HCT 35.9* 02/16/2016   MCV 81.6 02/16/2016   PLT 264 02/16/2016    Assessment / Plan: Induction of labor due to postdates,  progressing well on pitocin  Labor: Latent labor, increasing pitocin this AM, was on cytotec overnight with progression from 3 cm to 4.5 cm Preeclampsia:  no signs or symptoms of toxicity Fetal Wellbeing:  Category II Pain Control:  Epidural I/D:  n/a Anticipated MOD:  NSVD and guarded for LGA status and thick meconium  Jacqueline Perkins, CNM 02/17/2016, 8:48 AM

## 2016-02-18 LAB — CBC
HEMATOCRIT: 33.9 % — AB (ref 36.0–46.0)
HEMOGLOBIN: 11.6 g/dL — AB (ref 12.0–15.0)
MCH: 27.8 pg (ref 26.0–34.0)
MCHC: 34.2 g/dL (ref 30.0–36.0)
MCV: 81.1 fL (ref 78.0–100.0)
Platelets: 274 10*3/uL (ref 150–400)
RBC: 4.18 MIL/uL (ref 3.87–5.11)
RDW: 14.8 % (ref 11.5–15.5)
WBC: 23.2 10*3/uL — AB (ref 4.0–10.5)

## 2016-02-18 LAB — KLEIHAUER-BETKE STAIN
# Vials RhIg: 1
Fetal Cells %: 0 %
QUANTITATION FETAL HEMOGLOBIN: 0 mL

## 2016-02-18 MED ORDER — RHO D IMMUNE GLOBULIN 1500 UNIT/2ML IJ SOSY
300.0000 ug | PREFILLED_SYRINGE | Freq: Once | INTRAMUSCULAR | Status: AC
  Administered 2016-02-18: 300 ug via INTRAVENOUS
  Filled 2016-02-18: qty 2

## 2016-02-18 NOTE — Progress Notes (Signed)
Post Partum Day #1 Subjective: no complaints, up ad lib, voiding and tolerating PO  Objective: Blood pressure 117/71, pulse 72, temperature 97.8 F (36.6 C), temperature source Oral, resp. rate 20, height 5\' 6"  (1.676 m), weight 226 lb (102.513 kg), last menstrual period 05/07/2015, SpO2 98 %, unknown if currently breastfeeding.  Physical Exam:  General: alert, cooperative and no distress Lochia: appropriate Uterine Fundus: firm Incision: none DVT Evaluation: No evidence of DVT seen on physical exam. No cords or calf tenderness.   Recent Labs  02/16/16 0755 02/18/16 0536  HGB 12.2 11.6*  HCT 35.9* 33.9*    Assessment/Plan: Plan for discharge tomorrow, Breastfeeding and Lactation consult   LOS: 3 days   Roe CoombsRachelle A Damaria Stofko, CNM 02/18/2016, 6:40 AM

## 2016-02-18 NOTE — Lactation Note (Signed)
This note was copied from a baby's chart. Lactation Consultation Note  Patient Name: Jacqueline Perkins [id number]'s Date: 02/18/2016 Reason for consult: Initial assessment   Maternal Data Has patient been taught Hand Expression?: Yes Does the patient have breastfeeding experience prior to this delivery?: No  Feeding Feeding Type: Breast Milk Length of feed: 8 min  LATCH Score/Interventions Latch: Repeated attempts needed to sustain latch, nipple held in mouth throughout feeding, stimulation needed to elicit sucking reflex. Intervention(s): Waking techniques Intervention(s): Assist with latch  Audible Swallowing: Spontaneous and intermittent  Type of Nipple: Everted at rest and after stimulation  Comfort (Breast/Nipple): Filling, red/small blisters or bruises, mild/mod discomfort  Problem noted: Mild/Moderate discomfort  Hold (Positioning): Assistance needed to correctly position infant at breast and maintain latch. Intervention(s): Support Pillows;Breastfeeding basics reviewed  LATCH Score: 7  Lactation Tools Discussed/Used     Consult Status      Jeslin Bazinet G 02/18/2016, 5:22 AM

## 2016-02-18 NOTE — Anesthesia Postprocedure Evaluation (Signed)
Anesthesia Post Note  Patient: Jacqueline Perkins  Procedure(s) Performed: * No procedures listed *  Patient location during evaluation: Mother Baby Anesthesia Type: Epidural Level of consciousness: awake and alert, oriented and patient cooperative Pain management: pain level controlled Vital Signs Assessment: post-procedure vital signs reviewed and stable Respiratory status: spontaneous breathing Cardiovascular status: stable Postop Assessment: no headache, epidural receding, patient able to bend at knees and no signs of nausea or vomiting Anesthetic complications: no Comments: Pain level 1    Last Vitals:  Filed Vitals:   02/17/16 2328 02/18/16 0545  BP: 122/60 117/71  Pulse: 77 72  Temp: 36.7 C 36.6 C  Resp: 18 20    Last Pain:  Filed Vitals:   02/18/16 0606  PainSc: 0-No pain                 [name]

## 2016-02-18 NOTE — Lactation Note (Signed)
This note was copied from a baby's chart. Lactation Consultation Note New mom states baby has been fussy for over an hr. Mom STS, has space between cone shaped breast. Short shaft nipples. Compressible. Baby irritable, will not latch. Mom BF in cross cradle when entered rm. I assisited in football position. Baby still fussy. Has resessed chin, upper labial frenulum. Mom has a slight lisp. Noted dark blueish bruises to Lt. Posterior areola. Denies pain. Gave shaells, to wear in bra between BF, hand pump to evert nipple before latching. Baby arching refusing breast. Encouraged mom to hold and console baby. Mom encouraged to feed baby 8-12 times/24 hours and with feeding cues. Referred to Baby and Me Book in Breastfeeding section Pg. 22-23 for position options and Proper latch demonstration.[facility] brochure given w/resources, support groups and LC services. Patient Name: Jacqueline Robyne PeersCassandra Perkins [id number]'s Date: 02/18/2016 Reason for consult: Initial assessment   Maternal Data Has patient been taught Hand Expression?: Yes Does the patient have breastfeeding experience prior to this delivery?: No  Feeding Feeding Type: Breast Fed Length of feed: 0 min  LATCH Score/Interventions Latch: Too sleepy or reluctant, no latch achieved, no sucking elicited. Intervention(s): Skin to skin;Teach feeding cues;Waking techniques Intervention(s): Adjust position;Assist with latch;Breast massage;Breast compression  Audible Swallowing: None Intervention(s): Skin to skin;Hand expression  Type of Nipple: Everted at rest and after stimulation (very short shaft) Intervention(s): Shells;Hand pump  Comfort (Breast/Nipple): Filling, red/small blisters or bruises, mild/mod discomfort  Problem noted: Mild/Moderate discomfort Interventions (Mild/moderate discomfort): Pre-pump if needed;Hand massage;Hand expression  Hold (Positioning): Full assist, staff holds infant at breast Intervention(s): Breastfeeding basics  reviewed;Support Pillows;Position options;Skin to skin  LATCH Score: 3  Lactation Tools Discussed/Used Tools: Shells;Pump Shell Type: Inverted Breast pump type: Manual WIC Program: Yes Pump Review: Setup, frequency, and cleaning;Milk Storage Initiated by:: Peri JeffersonL. Sonja Manseau RN Date initiated:: 02/18/16   Consult Status Consult Status: Follow-up Date: 02/18/16 Follow-up type: In-patient    Charyl DancerCARVER, Alyah Boehning G 02/18/2016, 5:26 AM

## 2016-02-19 LAB — TYPE AND SCREEN
ABO/RH(D): AB NEG
ANTIBODY SCREEN: POSITIVE
DAT, IgG: NEGATIVE
UNIT DIVISION: 0
UNIT DIVISION: 0

## 2016-02-19 NOTE — Discharge Summary (Signed)
Obstetric Discharge Summary Reason for Admission: onset of labor Prenatal Procedures: none Intrapartum Procedures: spontaneous vaginal delivery Postpartum Procedures: none Complications-Operative and Postpartum: none HEMOGLOBIN  Date Value Ref Range Status  02/18/2016 11.6* 12.0 - 15.0 g/dL Final   HCT  Date Value Ref Range Status  02/18/2016 33.9* 36.0 - 46.0 % Final    Physical Exam:  General: alert Lochia: appropriate Uterine Fundus: firm Incision: healing well DVT Evaluation: No evidence of DVT seen on physical exam.  Discharge Diagnoses: Term Pregnancy-delivered  Discharge Information: Date: 02/19/2016 Activity: pelvic rest Diet: routine Medications: Percocet Condition: stable Instructions: refer to practice specific booklet Discharge to: home Follow-up Information    Follow up with HARPER,CHARLES A, MD.   Specialty:  Obstetrics and Gynecology   Contact information:   [address] [place] [address] [phone]       Newborn Data: Live born female  Birth Weight: 8 lb 6.7 oz (3819 g) APGAR: 9, 9  Home with mother.  MARSHALL,BERNARD A 02/19/2016, 7:51 AM

## 2016-02-19 NOTE — Lactation Note (Signed)
This note was copied from a baby's chart. Lactation Consultation Note  Follow up visit made prior to discharge.  Mom states baby is now latching on easily.  Some soreness on right nipples. Nipple pink and intact.  Comfort gels given with instructions.  Reviewed discharge teaching including engorgement treatment.  Lactation outpatient services and support reviewed and encouraged.  Patient Name: Jacqueline Perkins [id number]'NToday's Date: 02/19/2016     Maternal Data    Feeding Feeding Type: Breast Fed Length of feed: 10 min  LATCH Score/Interventions                      Lactation Tools Discussed/Used     Consult Status      [name], [name] 02/19/2016, 9:27 AM

## 2016-02-19 NOTE — Progress Notes (Signed)
Discharge instructions reviewed, all questions answered.  No equipment sent home with patient IV d/c prior to discharge.  In stable condition, baby sent home in care of mother and father.  Escorted to main entrance by NT.

## 2016-02-19 NOTE — Discharge Instructions (Signed)
Discharge instructions ° °· You can wash your hair °· Shower °· Eat what you want °· Drink what you want °· See me in 6 weeks °· Your ankles are going to swell more in the next 2 weeks than when pregnant °· No sex for 6 weeks ° ° °Kaimana Lurz A, MD 02/19/2016 ° ° °

## 2016-02-21 ENCOUNTER — Encounter: Payer: Self-pay | Admitting: *Deleted

## 2016-02-25 LAB — RH IG WORKUP (INCLUDES ABO/RH)
ABO/RH(D): AB NEG
Gestational Age(Wks): 40.6
UNIT DIVISION: 0

## 2016-02-29 ENCOUNTER — Encounter: Payer: Self-pay | Admitting: Certified Nurse Midwife

## 2016-02-29 ENCOUNTER — Ambulatory Visit (INDEPENDENT_AMBULATORY_CARE_PROVIDER_SITE_OTHER): Payer: Medicaid Other | Admitting: Certified Nurse Midwife

## 2016-02-29 VITALS — BP 117/78 | HR 87 | Temp 98.6°F | Wt 201.0 lb

## 2016-02-29 DIAGNOSIS — Z30011 Encounter for initial prescription of contraceptive pills: Secondary | ICD-10-CM

## 2016-02-29 MED ORDER — NORETHINDRONE 0.35 MG PO TABS
1.0000 | ORAL_TABLET | Freq: Every day | ORAL | Status: DC
Start: 2016-02-29 — End: 2016-07-10

## 2016-02-29 NOTE — Progress Notes (Signed)
Patient ID: Jacqueline PeersCassandra Perkins, female   DOB: 1995-11-19, 20 y.o.   MRN: [id number]  Subjective:     Jacqueline PeersCassandra Perkins is a 20 y.o. female who presents for a postpartum visit. She is 2 weeks postpartum following a spontaneous vaginal delivery. I have fully reviewed the prenatal and intrapartum course. The delivery was at 40 gestational weeks. Outcome: spontaneous vaginal delivery. Anesthesia: epidural. Postpartum course has been normal. Baby's course has been complicated by hospital admission for jaundice. Baby is feeding by both breast and bottle - Similac Advance. Bleeding moderate lochia, brown and red. Bowel function is normal. Bladder function is normal. Patient is not sexually active. Contraception method is abstinence. Postpartum depression screening: negative.  Tobacco, alcohol and substance abuse history reviewed.  Adult immunizations reviewed including TDAP, rubella and varicella.  The following portions of the patient's history were reviewed and updated as appropriate: allergies, current medications, past family history, past medical history, past social history, past surgical history and problem list.  Review of Systems A comprehensive review of systems was negative.   Objective:    BP 117/78 mmHg  Pulse 87  Temp(Src) 98.6 F (37 C)  Wt 201 lb (91.173 kg)  LMP 05/07/2015  General:  alert, cooperative and no distress   Breasts:  inspection negative, no nipple discharge or bleeding, no masses or nodularity palpable  Lungs: clear to auscultation bilaterally  Heart:  regular rate and rhythm, S1, S2 normal, no murmur, click, rub or gallop  Abdomen: soft, non-tender; bowel sounds normal; no masses,  no organomegaly   Vulva:  not evaluated  Vagina: not evaluated  Cervix:  not evaluated  Corpus: normal  Adnexa:  not evaluated  Rectal Exam: Not performed.          50% of 15 min visit spent on counseling and coordination of care.  Assessment:     Normal 2 week postpartum exam. Pap  smear not done at today's visit.  Plan:    1. Contraception: abstinence 2.   Discussed contraception at length will try micronor and if needed paraguard at a later date.   3. Follow up in: 4 weeks or as needed.  2hr GTT for h/o GDM/screening for DM q 3 yrs per ADA recommendations Preconception counseling provided Healthy lifestyle practices reviewed

## 2016-03-28 ENCOUNTER — Ambulatory Visit (INDEPENDENT_AMBULATORY_CARE_PROVIDER_SITE_OTHER): Payer: BLUE CROSS/BLUE SHIELD | Admitting: Certified Nurse Midwife

## 2016-03-28 DIAGNOSIS — Z01419 Encounter for gynecological examination (general) (routine) without abnormal findings: Secondary | ICD-10-CM

## 2016-03-29 ENCOUNTER — Encounter: Payer: Self-pay | Admitting: Certified Nurse Midwife

## 2016-03-29 NOTE — Progress Notes (Signed)
Patient ID: Jacqueline Perkins, female   DOB: 02-03-1996, 20 y.o.   MRN: [id number]  Subjective:     Jacqueline PeersCassandra Perkins is a 20 y.o. female who presents for a postpartum visit. She is 6 weeks postpartum following a spontaneous vaginal delivery. I have fully reviewed the prenatal and intrapartum course. The delivery was at 39 gestational weeks. Outcome: spontaneous vaginal delivery. Anesthesia: epidural. Postpartum course has been normal. Baby's course has been normal. Baby is feeding by breast. Bleeding thin lochia, brown and red. Bowel function is normal. Bladder function is normal. Patient is sexually active. Contraception method is oral progesterone-only contraceptive. Postpartum depression screening: negative.  Tobacco, alcohol and substance abuse history reviewed.  Adult immunizations reviewed including TDAP, rubella and varicella.  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  Needs letter faxed to [facility]>  [phone].  Reports insomnia.    The following portions of the patient's history were reviewed and updated as appropriate: allergies, current medications, past family history, past medical history, past social history, past surgical history and problem list.  Review of Systems Pertinent items are noted in HPI.   Objective:    BP 129/92 mmHg  Pulse 106  Wt 204 lb (92.534 kg)  General:  alert, cooperative and no distress   Breasts:  inspection negative, no nipple discharge or bleeding, no masses or nodularity palpable  Lungs: clear to auscultation bilaterally  Heart:  regular rate and rhythm, S1, S2 normal, no murmur, click, rub or gallop  Abdomen: soft, non-tender; bowel sounds normal; no masses,  no organomegaly   Vulva:  normal  Vagina: normal vagina  Cervix:  no bleeding following Pap and no cervical motion tenderness  Corpus: normal size, contour, position, consistency, mobility, non-tender  Adnexa:  normal adnexa  Rectal Exam: Not performed.          50% of 25 min visit spent on  counseling and coordination of care.  Assessment:     Normal 6 week postpartum exam. Pap smear done at today's visit.  Plan:    1. Contraception: oral progesterone-only contraceptive 2.  Work letter completed 3. Follow up in: 1 year or as needed.  2hr GTT for h/o GDM/screening for DM q 3 yrs per ADA recommendations Preconception counseling provided Healthy lifestyle practices reviewed

## 2016-03-30 LAB — PAP IG W/ RFLX HPV ASCU: PAP SMEAR COMMENT: 0

## 2016-03-31 LAB — NUSWAB VG+, CANDIDA 6SP
CANDIDA GLABRATA, NAA: NEGATIVE
CANDIDA TROPICALIS, NAA: NEGATIVE
Candida albicans, NAA: NEGATIVE
Candida krusei, NAA: NEGATIVE
Candida lusitaniae, NAA: NEGATIVE
Candida parapsilosis, NAA: NEGATIVE
Chlamydia trachomatis, NAA: NEGATIVE
Neisseria gonorrhoeae, NAA: NEGATIVE
Trich vag by NAA: NEGATIVE

## 2016-04-12 ENCOUNTER — Encounter: Payer: Self-pay | Admitting: *Deleted

## 2016-04-27 ENCOUNTER — Ambulatory Visit (INDEPENDENT_AMBULATORY_CARE_PROVIDER_SITE_OTHER): Payer: BLUE CROSS/BLUE SHIELD | Admitting: Certified Nurse Midwife

## 2016-04-27 ENCOUNTER — Encounter: Payer: Self-pay | Admitting: Certified Nurse Midwife

## 2016-04-27 ENCOUNTER — Encounter: Payer: Self-pay | Admitting: *Deleted

## 2016-04-27 VITALS — BP 133/75 | HR 104 | Wt 206.0 lb

## 2016-04-27 DIAGNOSIS — Z3041 Encounter for surveillance of contraceptive pills: Secondary | ICD-10-CM | POA: Diagnosis not present

## 2016-04-27 DIAGNOSIS — N939 Abnormal uterine and vaginal bleeding, unspecified: Secondary | ICD-10-CM

## 2016-04-27 NOTE — Progress Notes (Signed)
Patient ID: Jacqueline Perkins, female   DOB: Mar 01, 1996, 20 y.o.   MRN: [id number]   Chief Complaint  Patient presents with  . Vaginal Bleeding    HPI Jacqueline Perkins is a 20 y.o. female.  Here for f/u on postpartum bleeding has been bleeding since delivery over 10 weeks ago.  Is scheduled to drill next weekend for the first time since delivery, [facility] is concerned that she is still bleeding.  Has been taking her POP as prescribed daily, not missing pills and taking at the same time every day.  Patient is not currently breast feeding.     HPI  Past Medical History  Diagnosis Date  . Medical history non-contributory   . Bell's palsy   . Heart murmur     Past Surgical History  Procedure Laterality Date  . No past surgeries      Family History  Problem Relation Age of Onset  . Hyperlipidemia Father   . Diabetes Paternal Grandfather   . Bipolar disorder Mother     Social History Social History  Substance Use Topics  . Smoking status: Never Smoker   . Smokeless tobacco: None  . Alcohol Use: No    No Known Allergies  Current Outpatient Prescriptions  Medication Sig Dispense Refill  . norethindrone (MICRONOR,CAMILA,ERRIN) 0.35 MG tablet Take 1 tablet (0.35 mg total) by mouth daily. 1 Package 11   No current facility-administered medications for this visit.    Review of Systems Review of Systems Constitutional: negative for fatigue and weight loss Respiratory: negative for cough and wheezing Cardiovascular: negative for chest pain, fatigue and palpitations Gastrointestinal: negative for abdominal pain and change in bowel habits Genitourinary: +AUB postpartum Integument/breast: negative for nipple discharge Musculoskeletal:negative for myalgias Neurological: negative for gait problems and tremors Behavioral/Psych: negative for abusive relationship, depression Endocrine: negative for temperature intolerance     Blood pressure 133/75, pulse 104, weight 206 lb (93.441  kg), unknown if currently breastfeeding.  Physical Exam Physical Exam General:   alert  Skin:   no rash or abnormalities  Lungs:   clear to auscultation bilaterally  Heart:   regular rate and rhythm, S1, S2 normal, no murmur, click, rub or gallop  Breasts:   deferred  Abdomen:  normal findings: no organomegaly, soft, non-tender and no hernia  Pelvis:  deferred    50% of 15 min visit spent on counseling and coordination of care.   Data Reviewed Previous medical hx, meds, labs  Assessment     AUB postpartum Contraception management     Plan   Trial of LoLo given to patient.  Encouraged f/u if COPs are not working for her AUB.  Patient verbalized understanding.    Encouraged patient to take her prenatal vitamins.     Work letter completed to be out of drill this next weekend due to postpartum complications.    No orders of the defined types were placed in this encounter.   No orders of the defined types were placed in this encounter.     Possible management options include: US, LARK, Provera Follow up as needed.

## 2016-06-28 ENCOUNTER — Ambulatory Visit: Payer: Self-pay | Admitting: Certified Nurse Midwife

## 2016-06-28 ENCOUNTER — Ambulatory Visit: Payer: BLUE CROSS/BLUE SHIELD | Admitting: Certified Nurse Midwife

## 2016-07-10 ENCOUNTER — Ambulatory Visit (INDEPENDENT_AMBULATORY_CARE_PROVIDER_SITE_OTHER): Payer: BLUE CROSS/BLUE SHIELD | Admitting: Certified Nurse Midwife

## 2016-07-10 ENCOUNTER — Encounter: Payer: Self-pay | Admitting: Certified Nurse Midwife

## 2016-07-10 VITALS — BP 114/72 | Resp 15 | Wt 208.0 lb

## 2016-07-10 DIAGNOSIS — Z3041 Encounter for surveillance of contraceptive pills: Secondary | ICD-10-CM | POA: Diagnosis not present

## 2016-07-10 DIAGNOSIS — Z309 Encounter for contraceptive management, unspecified: Secondary | ICD-10-CM | POA: Diagnosis not present

## 2016-07-10 DIAGNOSIS — E669 Obesity, unspecified: Secondary | ICD-10-CM

## 2016-07-10 DIAGNOSIS — Z3009 Encounter for other general counseling and advice on contraception: Secondary | ICD-10-CM

## 2016-07-10 MED ORDER — PHENTERMINE HCL 37.5 MG PO CAPS: 37.5000 mg | ORAL_CAPSULE | ORAL | 3 refills | Status: AC

## 2016-07-10 NOTE — Progress Notes (Signed)
Subjective:    Jacqueline Perkins is a 20 y.o. female who presents for contraception counseling. The patient has no complaints today. The patient is sexually active. Pertinent past medical history: none.  Had period for about 2.5 months, desires to continue on LoLo for a few more months, is concerned that she is not loosing weight.  Not currently breast feeding. Has a gym membership.    The information documented in the HPI was reviewed and verified.  Menstrual History: OB History    Gravida Para Term Preterm AB Living   1 1 1     1    SAB TAB Ectopic Multiple Live Births         0 1       Patient's last menstrual period was 06/24/2016.   Patient Active Problem List   Diagnosis Date Noted  . Postpartum examination following vaginal delivery 02/29/2016   Past Medical History:  Diagnosis Date  . Bell's palsy   . Heart murmur   . Medical history non-contributory     Past Surgical History:  Procedure Laterality Date  . NO PAST SURGERIES       Current Outpatient Prescriptions:  .  Norethindrone-Ethinyl Estradiol-Fe Biphas (LO LOESTRIN FE) 1 MG-10 MCG / 10 MCG tablet, Take 1 tablet by mouth daily., Disp: , Rfl:  No Known Allergies  Social History  Substance Use Topics  . Smoking status: Never Smoker  . Smokeless tobacco: Not on file  . Alcohol use No    Family History  Problem Relation Age of Onset  . Hyperlipidemia Father   . Diabetes Paternal Grandfather   . Bipolar disorder Mother        Review of Systems Constitutional: negative for weight loss Genitourinary:negative for abnormal menstrual periods and vaginal discharge   Objective:   BP 114/72   Resp 15   Wt 208 lb (94.3 kg)   LMP 06/24/2016   BMI 33.57 kg/m    General:   alert  Skin:   no rash or abnormalities  Lungs:   clear to auscultation bilaterally  Heart:   regular rate and rhythm, S1, S2 normal, no murmur, click, rub or gallop   Lab Review Urine pregnancy test Labs reviewed yes Radiologic  studies reviewed no  50% of 15 min visit spent on counseling and coordination of care.   Assessment:    20 y.o., continuing OCP (estrogen/progesterone), no contraindications.    obesity   Plan:    All questions answered.  Meds ordered this encounter  Medications  . Norethindrone-Ethinyl Estradiol-Fe Biphas (LO LOESTRIN FE) 1 MG-10 MCG / 10 MCG tablet    Sig: Take 1 tablet by mouth daily.   No orders of the defined types were placed in this encounter.  Need to obtain previous records Follow up as needed or for annual exam May 2018.

## 2016-11-03 IMAGING — US US MFM OB LIMITED
1 series · 15 of 28 positions shown · non-contrast
Comparison: none

[Series 1: us mfm ob limited · 29 acquisitions, 15 frames shown]
[im 1/29]
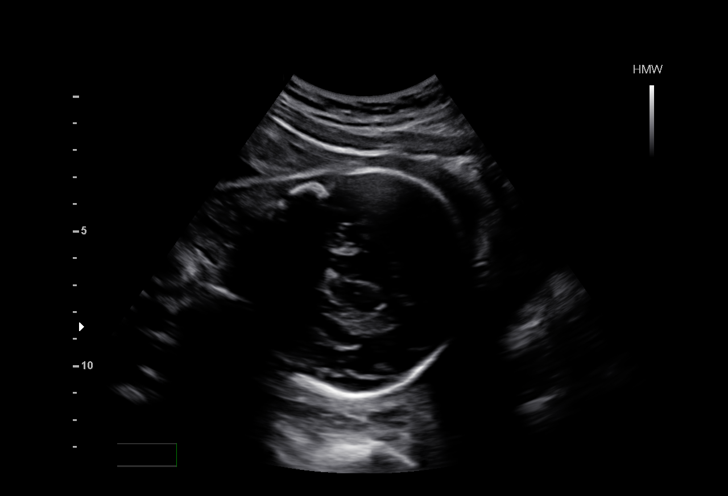
[im 3/29]
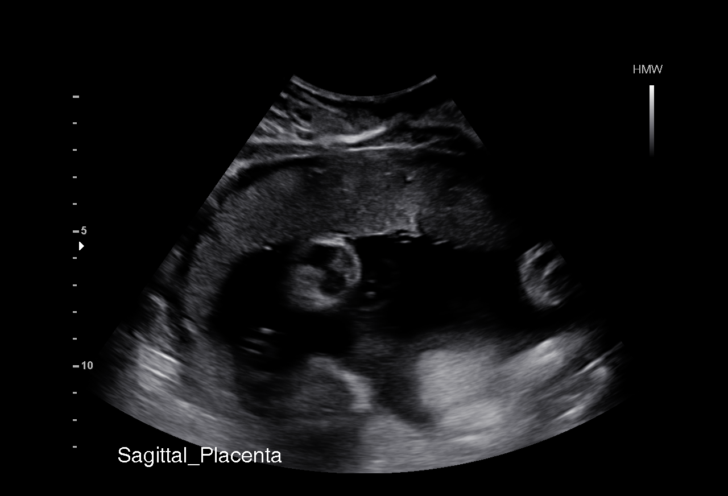
[im 5/29]
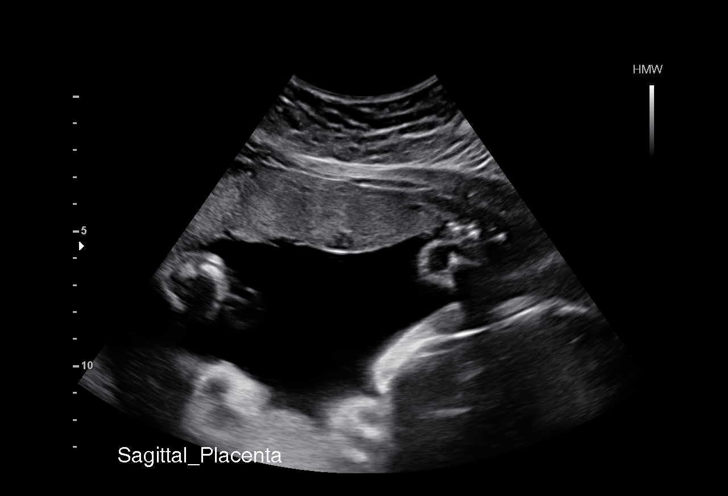
[im 7/29]
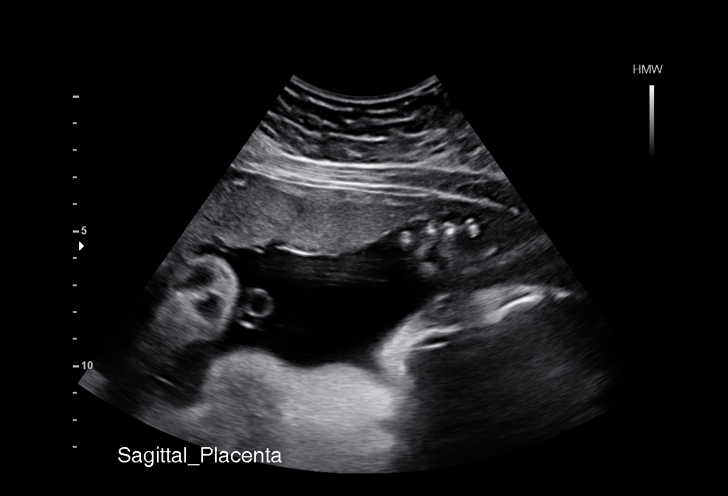
[im 9/29]
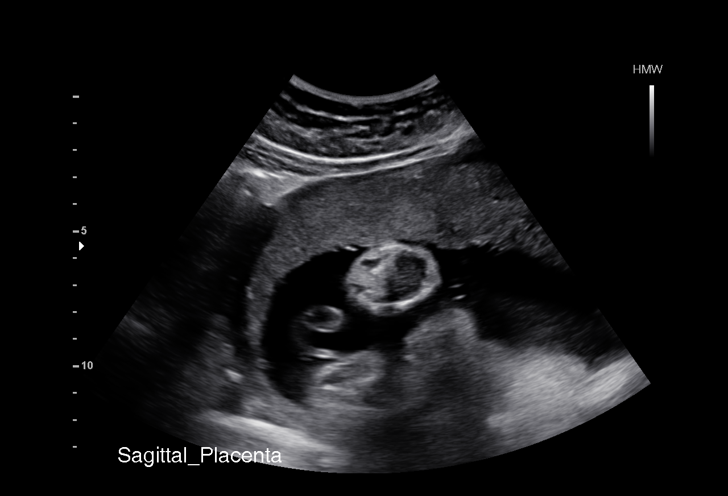
[im 11/29]
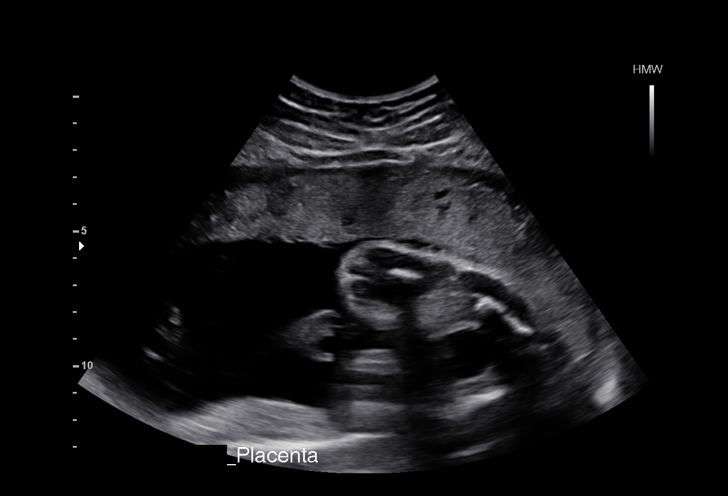
[im 13/29]
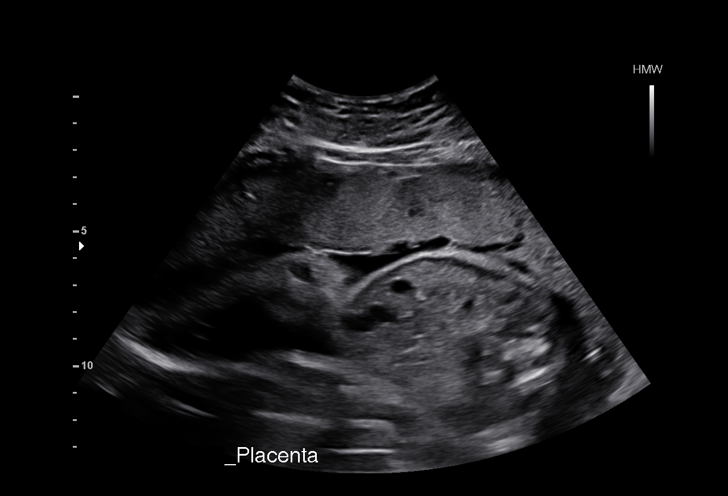
[im 15/29]
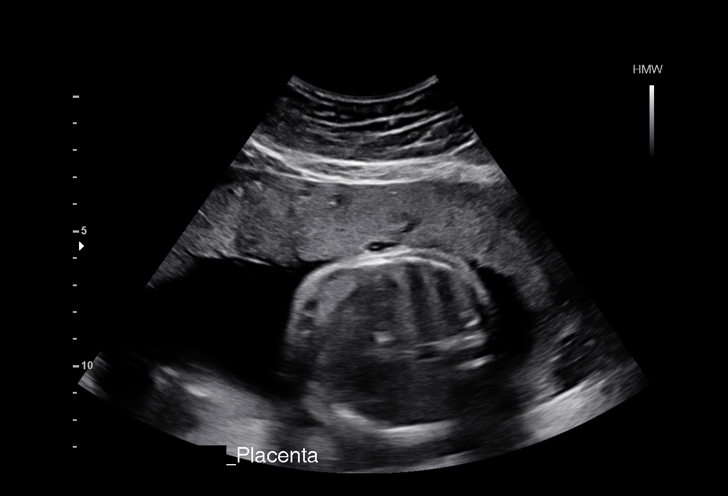
[im 16/29]
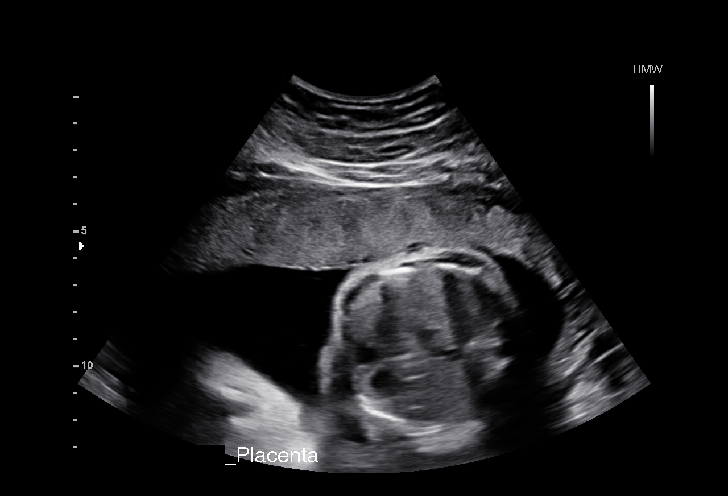
[im 18/29]
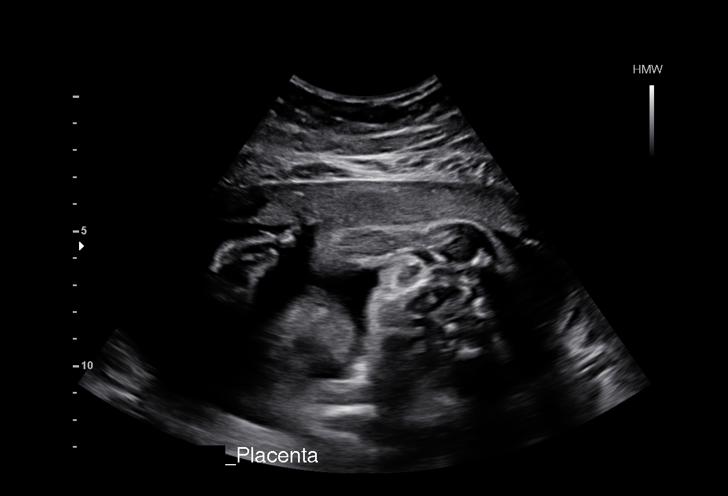
[im 20/29]
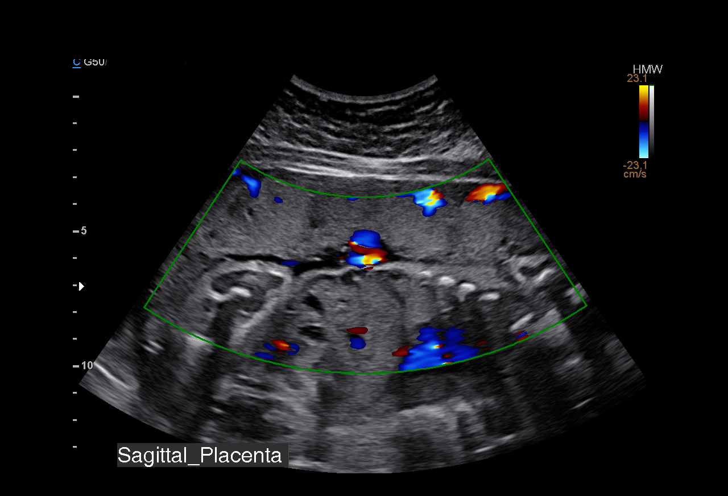
[im 22/29]
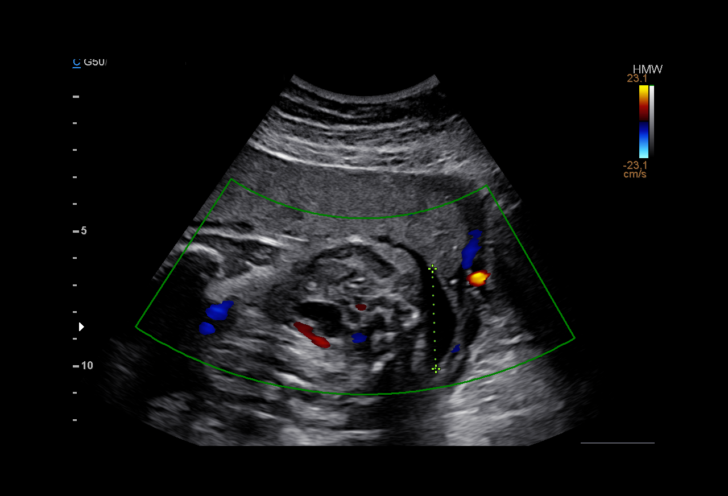
[im 24/29]
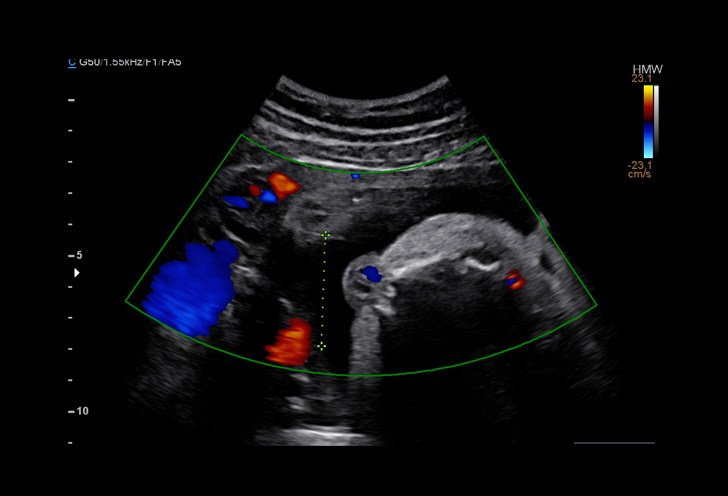
[im 26/29]
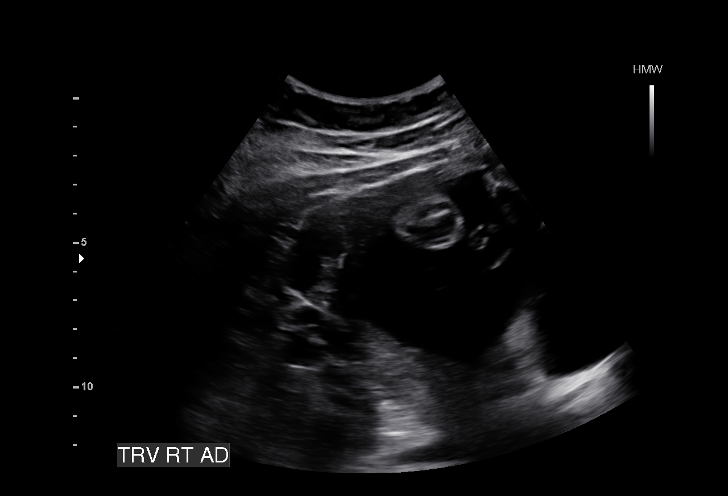
[im 29/29]
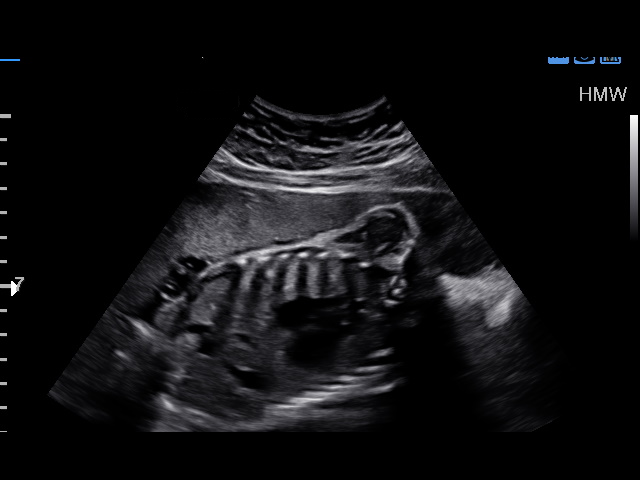

[15 of 28 positions shown; findings below may reference images not displayed]

am)

Name:       CINGHIALE HILA                     Visit  12/01/2015 [DATE]
Date:

Performed By:      Lkw Tiger         Secondary          PURNA PASCAL
DIMITRY/Triage

1  KULBIR TUNNELL            [id number]       [id number]     [id number]
Indications

29 weeks gestation of pregnancy
Traumatic injury during pregnancy (fall)
Obesity complicating pregnancy, third
trimester
OB History

Height:        5'5"   Weight:   200        BMI:
Gravidity:     1         Term:  0        Prem:    0        SAB:   0
TOP:           0       Ectopic  0        Living:  0
:
Fetal Evaluation

Num Of Fetuses:      1
Fetal Heart          148
Rate(bpm):
Cardiac Activity:    Observed
Presentation:        Cephalic
Placenta:            Anterior, above cervical os
P. Cord Insertion:   Visualized, central

Amniotic Fluid
AFI FV:      Subjectively within normal limits
AFI Sum:     14.31    cm      48  %Tile     Larg Pckt:    3.71   cm
RUQ:   3.44    cm    RLQ:   3.56    cm   LUQ:    3.71    cm   LLQ:    3.6    cm
Comment     No placental abruption identified.
:
Gestational Age

LMP:           29w 5d        Date:  05/07/15                  EDD:   02/11/16
Best:          29w 5d    Det. By:   LMP  (05/07/15)           EDD:   02/11/16
Cervix Uterus Adnexa

Cervix
Not visualized (advanced GA >52wks)

Uterus
No abnormality visualized.

Left Ovary
Not visualized.

Right Ovary
Not visualized.

Cul De        No free fluid seen.
Sac:

Adnexa:       No abnormality visualized.
Impression

Single IUP at 29w 5d
Limited ultrasound performed s/p fall
Anterior placenta without previa.  No sonographic findings
suggestive of abruptio are appreciated.
Cephalic presentation
Normal amniotic fluid volume
Recommendations

Follow-up ultrasounds as clinically indicated.

## 2017-01-12 IMAGING — US US MFM OB COMP +14 WKS
1 series · 14 of 28 positions shown · non-contrast
Comparison: none

[Series 1: us mfm ob comp +14 wks · 50 acquisitions, 14 frames shown]
[im 2/50]
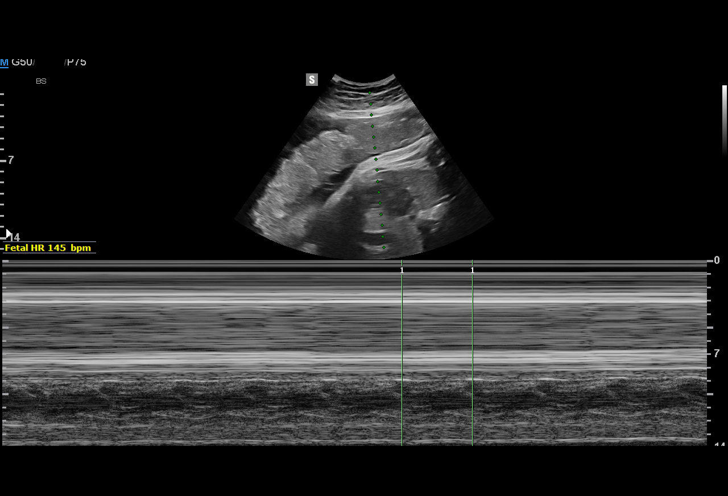
[im 6/50]
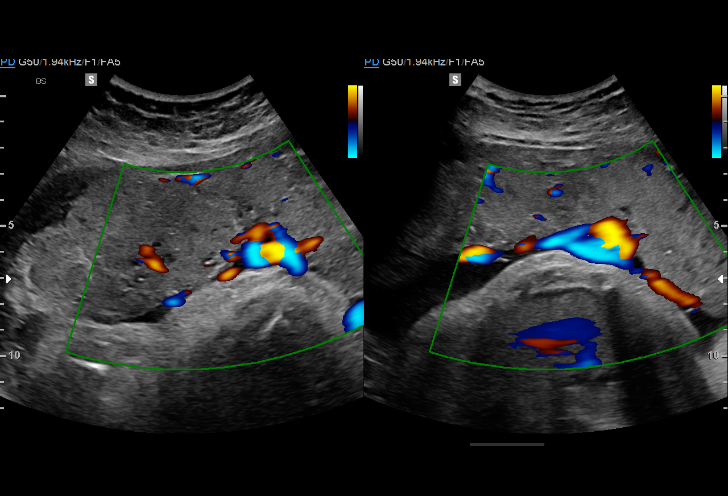
[im 10/50]
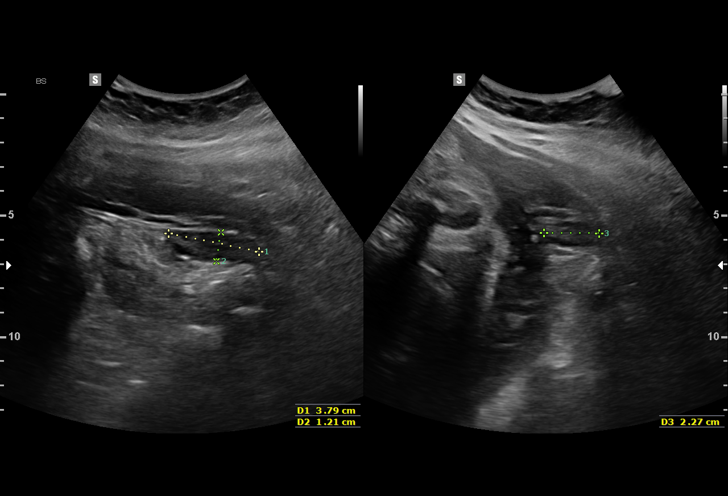
[im 13/50]
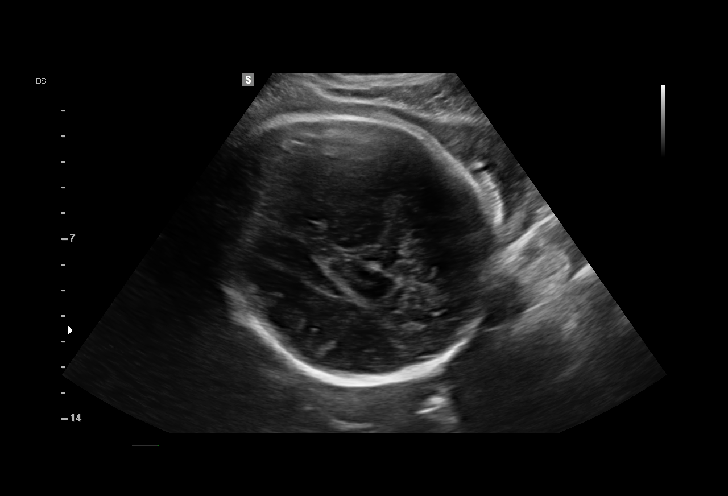
[im 17/50]
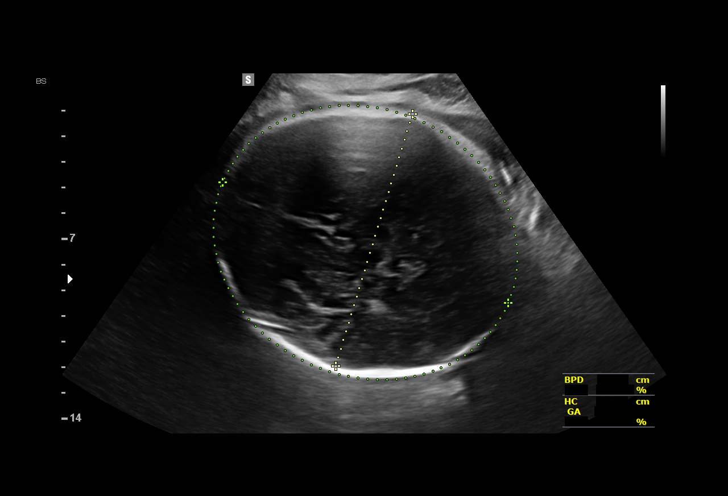
[im 20/50]
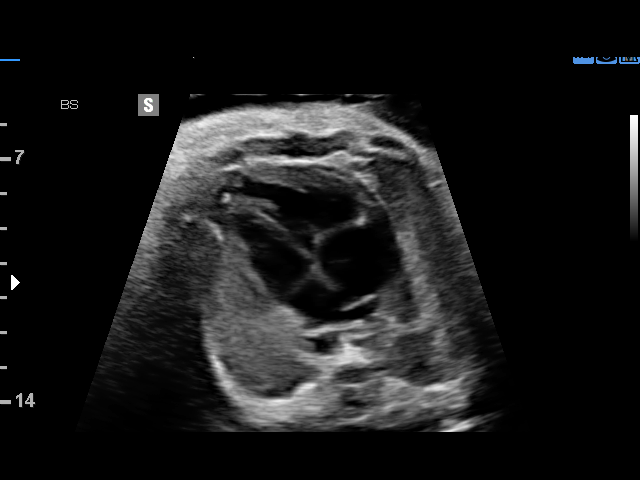
[im 24/50]
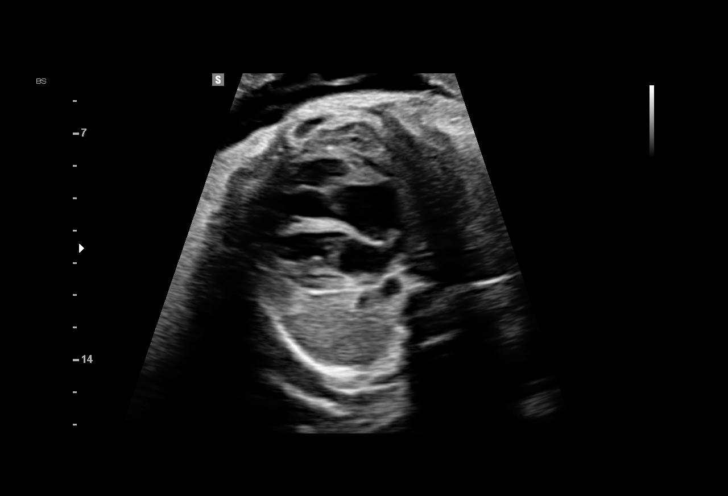
[im 28/50]
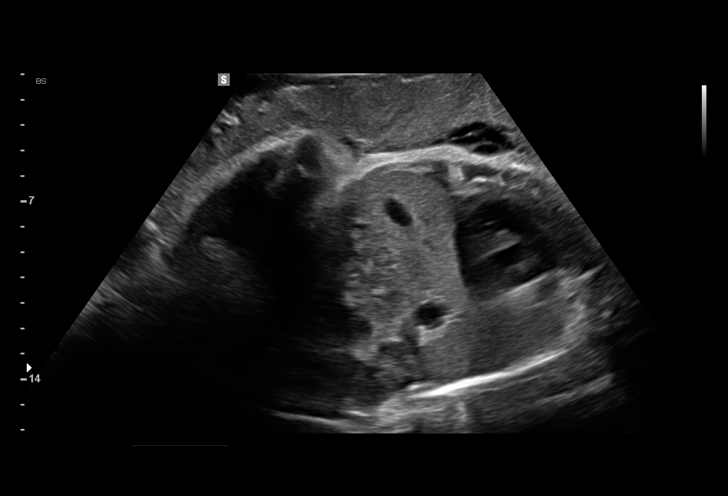
[im 31/50]
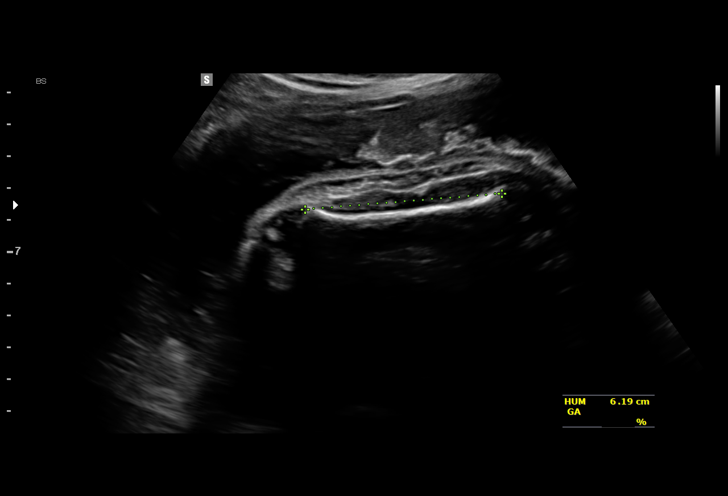
[im 35/50]
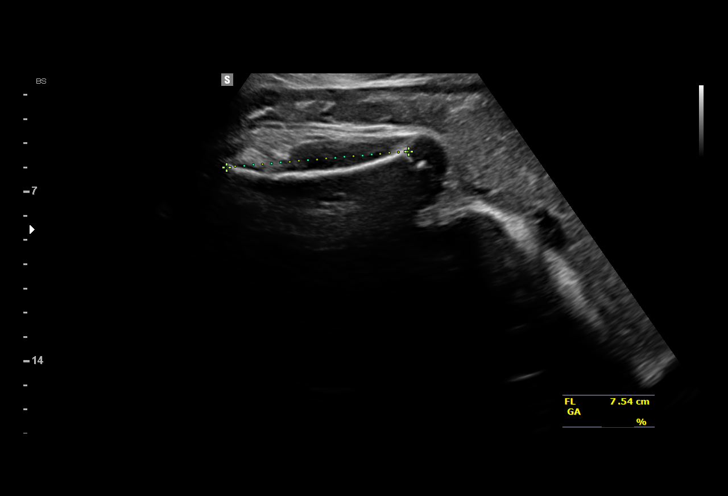
[im 39/50]
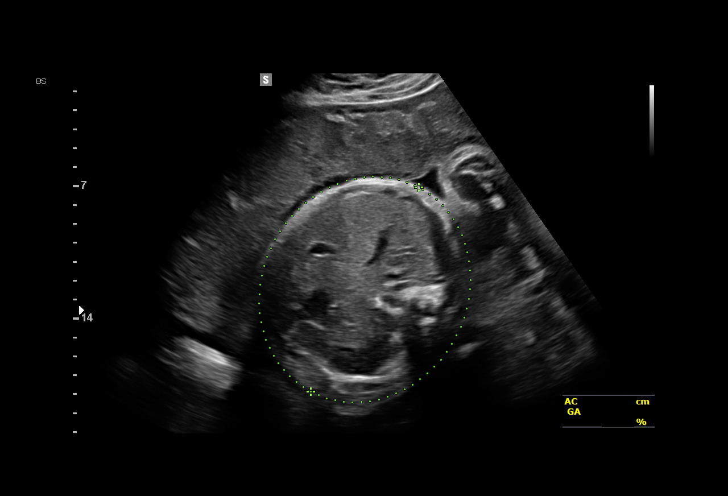
[im 42/50]
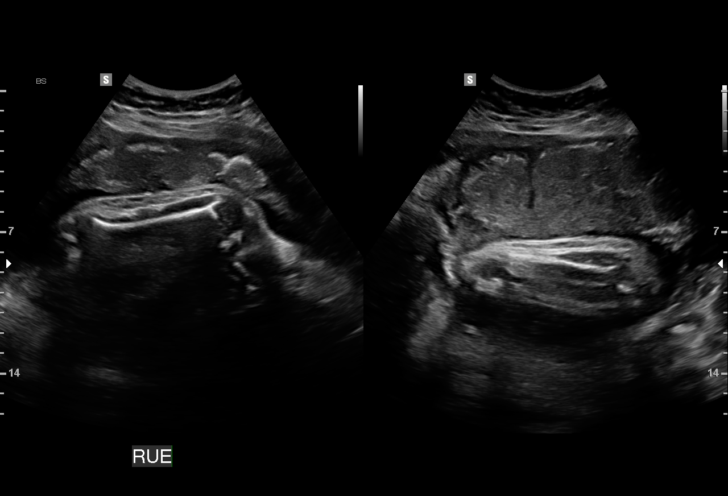
[im 46/50]
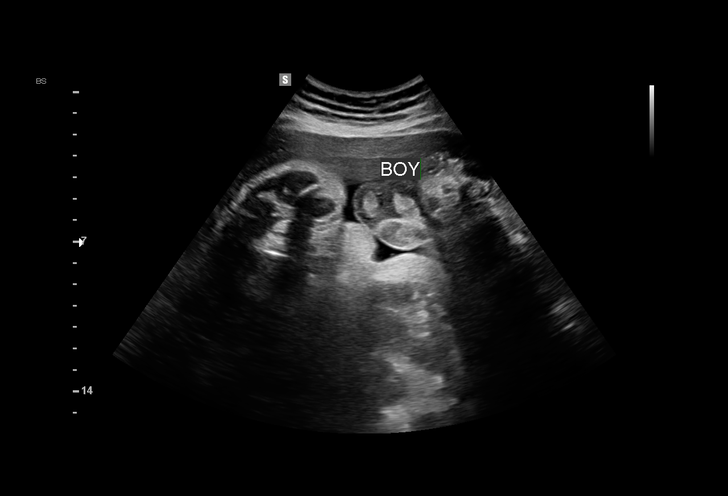
[im 50/50]
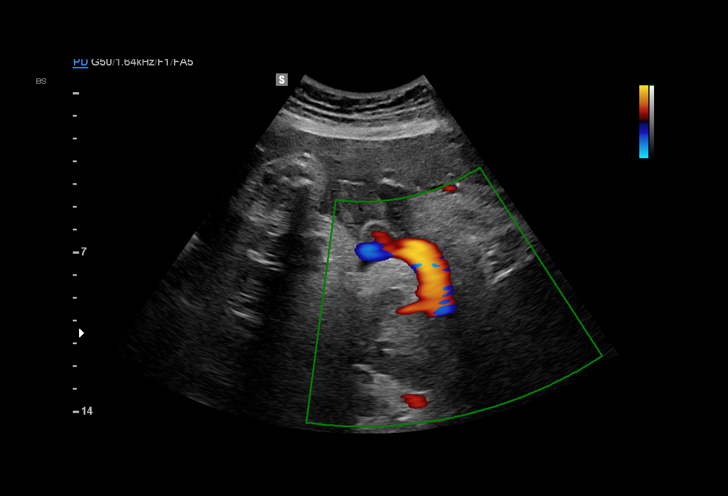

[14 of 28 positions shown; findings below may reference images not displayed]

Indications

Obesity complicating pregnancy, third
trimester
39 weeks gestation of pregnancy
Uterine size-date discrepancy, third trimester
(S>D)
OB History

Height:       5'5"    Weight:   200       BMI:
Gravidity:    1         Term:   0        Prem:   0        SAB:   0
TOP:          0       Ectopic:  0        Living: 0
Fetal Evaluation

Num Of Fetuses:     1
Fetal Heart         145
Rate(bpm):
Cardiac Activity:   Observed
Presentation:       Cephalic
Placenta:           Anterior, above cervical os
P. Cord Insertion:  Previously seen as normal

Amniotic Fluid
AFI FV:      Subjectively within normal limits
AFI Sum:     13.26   cm      54   %Tile     Larg Pckt:   4.38   cm
RUQ:   2.62   cm    RLQ:    4.38   cm    LUQ:   2.35    cm   LLQ:    3.91   cm
Biometry

BPD:     102.2  mm     G. Age:  N/A                     CI:        80.86   %   70 - 86
FL/HC:      20.8   %   20.7 -
HC:      358.9  mm     G. Age:  N/A            91  %    HC/AC:      1.00       0.87 -
AC:      359.7  mm     G. Age:  39w 6d         78  %    FL/BPD:     72.9   %   71 - 87
FL:       74.5  mm     G. Age:  38w 1d         22  %    FL/AC:      20.7   %   20 - 24
HUM:      61.3  mm     G. Age:  35w 3d        < 5  %

Est. FW:    6325  gm    8 lb 13 oz    > 90  %
Gestational Age

LMP:           39w 5d       Date:   05/07/15                 EDD:   02/11/16
U/S Today:     39w 0d                                        EDD:   02/16/16
Best:          39w 5d    Det. By:   LMP  (05/07/15)          EDD:   02/11/16
Anatomy

Cranium:          Appears normal         LVOT:             Appears normal
Fetal Cavum:      Not well visualized    Aortic Arch:      Not well visualized
Ventricles:       Appears normal         Ductal Arch:      Not well visualized
Choroid Plexus:   Appears normal         Diaphragm:        Appears normal
Cerebellum:       Not well visualized    Stomach:          Appears normal, left
sided
Posterior Fossa:  Not well visualized    Abdomen:          Appears normal
Nuchal Fold:      Not applicable (>20    Abdominal Wall:   Not well visualized
wks GA)
Face:             Orbits appear          Cord Vessels:     Appears normal (3
normal                                   vessel cord)
Lips:             Appears normal         Kidneys:          Appear normal
Palate:           Not well visualized    Bladder:          Appears normal
Fetal Thoracic:   Appears normal         Spine:            Not well visualized
Heart:            Appears normal         Upper             Present
(4CH, axis, and situs  Extremities:
RVOT:             Appears normal         Lower             Present
Extremities:

Other:  Fetus appears to be a male. Complete fetal anatomic survey
previously performed in office. Technically difficult due to advanced
GA and fetal position.
Cervix Uterus Adnexa

Cervix
Not visualized (advanced GA >75wks)

Left Ovary
Within normal limits.

Right Ovary
Within normal limits.

Adnexa:       No abnormality visualized.
Impression

Singleton intrauterine pregnancy at 39 weeks 5 days
gestation with fetal cardiac activity
Cephalic presentation
Anterior placenta without evidence of previa
Fetal growth at >90th centile
AFI >13 cm
No apparent birth defects although fetal anatomic survey
signficantly limited by advanced gestational age and fetal
position
Recommendations

Follow-up ultrasounds as clinically indicated.

## 2017-03-29 ENCOUNTER — Ambulatory Visit: Payer: Self-pay | Admitting: Certified Nurse Midwife
